# Patient Record
Sex: Female | Born: 1970 | Race: White | Hispanic: No | Marital: Married | Smoking: Never smoker
Health system: Southern US, Community
[De-identification: ages and names within clinical notes are randomized; demographics above are authoritative.]

## PROBLEM LIST (undated history)

## (undated) DIAGNOSIS — G5761 Lesion of plantar nerve, right lower limb: Secondary | ICD-10-CM

## (undated) DIAGNOSIS — S93492A Sprain of other ligament of left ankle, initial encounter: Secondary | ICD-10-CM

## (undated) DIAGNOSIS — C539 Malignant neoplasm of cervix uteri, unspecified: Secondary | ICD-10-CM

## (undated) HISTORY — PX: ROTATOR CUFF REPAIR: SHX139

## (undated) HISTORY — PX: ABDOMINAL HYSTERECTOMY: SHX81

## (undated) SURGERY — Surgical Case
Anesthesia: *Unknown

---

## 2005-08-20 ENCOUNTER — Ambulatory Visit: Payer: Self-pay | Admitting: Unknown Physician Specialty

## 2005-09-05 ENCOUNTER — Observation Stay: Payer: Self-pay | Admitting: Unknown Physician Specialty

## 2005-12-09 ENCOUNTER — Emergency Department: Payer: Self-pay | Admitting: Emergency Medicine

## 2013-05-13 ENCOUNTER — Ambulatory Visit: Payer: Self-pay | Admitting: Orthopedic Surgery

## 2013-06-03 ENCOUNTER — Ambulatory Visit: Payer: Self-pay | Admitting: Orthopedic Surgery

## 2013-07-15 DIAGNOSIS — M7521 Bicipital tendinitis, right shoulder: Secondary | ICD-10-CM | POA: Insufficient documentation

## 2013-07-15 DIAGNOSIS — S43439A Superior glenoid labrum lesion of unspecified shoulder, initial encounter: Secondary | ICD-10-CM | POA: Insufficient documentation

## 2013-07-15 DIAGNOSIS — M7580 Other shoulder lesions, unspecified shoulder: Secondary | ICD-10-CM | POA: Insufficient documentation

## 2013-07-15 DIAGNOSIS — M5412 Radiculopathy, cervical region: Secondary | ICD-10-CM | POA: Insufficient documentation

## 2013-10-25 ENCOUNTER — Ambulatory Visit: Payer: Self-pay | Admitting: Gastroenterology

## 2013-11-03 ENCOUNTER — Ambulatory Visit: Payer: Self-pay | Admitting: Gastroenterology

## 2013-12-06 ENCOUNTER — Ambulatory Visit: Payer: Self-pay | Admitting: Gastroenterology

## 2014-02-04 DIAGNOSIS — R1114 Bilious vomiting: Secondary | ICD-10-CM | POA: Insufficient documentation

## 2014-03-01 ENCOUNTER — Ambulatory Visit: Payer: Self-pay | Admitting: Surgery

## 2014-03-02 DIAGNOSIS — M75111 Incomplete rotator cuff tear or rupture of right shoulder, not specified as traumatic: Secondary | ICD-10-CM | POA: Insufficient documentation

## 2014-04-18 DIAGNOSIS — M7701 Medial epicondylitis, right elbow: Secondary | ICD-10-CM | POA: Insufficient documentation

## 2014-05-11 DIAGNOSIS — M7501 Adhesive capsulitis of right shoulder: Secondary | ICD-10-CM | POA: Insufficient documentation

## 2014-05-25 ENCOUNTER — Ambulatory Visit: Payer: Self-pay | Admitting: Surgery

## 2014-06-08 DIAGNOSIS — G629 Polyneuropathy, unspecified: Secondary | ICD-10-CM | POA: Insufficient documentation

## 2014-06-29 NOTE — Op Note (Signed)
PATIENT NAME:  Meredith Whitaker, Meredith Whitaker MR#:  [id number] DATE OF BIRTH:  1971-01-02  DATE OF PROCEDURE:  03/01/2014  PREOPERATIVE DIAGNOSIS:  Impingement/tendinopathy with superior labral tear from anterior to posterior, right shoulder.  POSTOPERATIVE DIAGNOSES:  1.  Impingement/tendinopathy with partial thickness rotator cuff tears of the subscapularis and supraspinatus tendons.  2.  Superior labral tear from anterior to posterior, right shoulder.  PROCEDURES:  1.  Arthroscopic superior labral tear from anterior to posterior repair.  2.  Arthroscopic subscapularis tendon repair.  3.    Arthroscopic subacromial decompression.  4.  Mini open repair of partial thickness supraspinatus tendon tear. 5.    Biceps tenodesis, right shoulder.  SURGEON: Pascal Lux, MD   ANESTHESIA: General endotracheal with an interscalene block placed preoperative by the anesthesiologist.   FINDINGS:  As noted above. The labral tear extended from the 10 o'clock to the 12:30 position.  There was also some fraying of the anterior labrum.  There was a near full thickness tear involving the superior fibers of the subscapularis tendon, as well as partial thickness tear involving approximately 40% of the footprint of the anterior insertional fibers of the supraspinatus tendon.  The articular surfaces of the glenoid and humerus both were in satisfactory condition.    COMPLICATIONS: None.   ESTIMATED BLOOD LOSS: Minimal.   TOTAL FLUIDS: 1000 mL of crystalloid.   TOURNIQUET: None.  DRAINS: None.  CLOSURE: 2-0 Vicryl subcuticular sutures.    BRIEF CLINICAL NOTE: The patient is a 44 year old female with a 1 year history of right shoulder pain.  Her symptoms have persisted despite medications, activity modification, etc. Her history and examination were consistent with impingement, tendinopathy with a SLAP tear, all of which were confirmed by MRI scan. She presents at this time for arthroscopy,  decompression, and SLAP  repair.   DESCRIPTION OF PROCEDURE: The patient underwent placement of an interscalene block in the preoperative holding area before she was brought into the operating room and lain in the supine position. After adequate general endotracheal intubation and anesthesia were obtained, the patient was repositioned in the beach chair position using the beach chair positioner. The right shoulder and upper extremity were prepped with ChloraPrep solution before being draped sterilely. Preoperative antibiotics were administered. The expected portal sites and incision site were injected with 0.5% Sensorcaine with epinephrine before the camera was placed in the posterior portal. The glenohumeral joint was thoroughly inspected with the findings as described above. An anterior portal was created using an outside-in technique. The labrum was carefully probed, confirming the above-noted findings. The frayed portions of the labrum were lightly debrided using the full radius resector.  In addition, the areas of partial thickness tears of the subscapularis and supraspinatus tendons also were debrided.  The exposed greater tuberosity and lesser tuberosity areas also were debrided down to provide a good bed of bleeding bone to stimulate healing. Finally, the exposed glenoid rim superiorly was roughened with the full radius resector to create a good bed of bleeding bone for the repair.   The labral repair was accomplished using a single BioKnotless anchor placed in the 12 o'clock position through a superolateral portal which had been placed using an outside-in technique. The subscapularis tendon was repaired using a second BioKnotless anchor placed through the anterior portal. Subsequent probing of both repairs demonstrated good stability.  In addition, the biceps tendon was released from its labral attachment, given that both the subscapularis tendon and superior labral regions required repair. The instruments were removed  from the  joint after suctioning the excess fluid.   The camera was repositioned though the posterior portal into the subacromial space. A separate lateral portal was created using an outside-in technique. The bursal tissues were debrided using the full radius resector before the ArthroCare wand was inserted and used to obtain hemostasis.  It also was used to reassess the coracoacromial ligament from its attachment along the anterior and lateral margins of the acromion as well as to remove the periosteal tissues off the undersurface of the anterior third of the acromion. A 4 mm Acromionizer bur was then utilized to complete the decompression by removing the undersurface of the anterior third of the acromion.  The instruments were then removed from the joint after suctioning the excess fluid.   An approximately 4-5 cm incision was made over the anterolateral aspect of the shoulder, incorporating the superolateral portal site.  The incision was carried down through the subcutaneous tissues to expose the deltoid fascia. The raphe between the anterior and middle thirds was developed to provide access into the subacromial space. Additional bursal tissues were debrided sharply.  The area of partial thickness tearing of the anterior third of the supraspinatus tendon was identified by palpation. A small incision was made longitudinally in line with the supraspinatus fibers to provide access to the torn portion of the tendon.  A single 2.9 JuggerKnot anchor was inserted and pulled tightly before both sets of sutures were passed on either side of the defect to effect the repair.  An apparent watertight closure was obtained.  The bicipital groove was identified by palpation and opened with the Bovie.  The biceps tendon was delivered through this defect into the wound.  The floor of the bicipital groove was roughened with a curet before a second 2.9 JuggerKnot anchor was inserted.  Again, both sets of sutures were passed through  the tendon and tied securely to effect the tenodesis.  The bicipital sheath was closed using #0 Ethibond interrupted sutures, incorporating the biceps tendon in order to further reinforce the tenodesis.   The wound was copiously irritated with sterile saline solution before the deltoid raphe was reapproximated using 2-0 Vicryl interrupted sutures. The subcutaneous tissues were closed with 2-0 Vicryl interrupted sutures before the skin was closed using 2-0 Vicryl inverted subcuticular sutures.  The portal sites also were closed using 2-0 Vicryl subcuticular inverted sutures before Steri-Strips were applied to the skin.  A sterile bulky occlusive dressing was applied at the shoulder before the arm was placed into a shoulder immobilizer.  The patient was then awakened, extubated, and returned to the recovery room in satisfactory condition after tolerating the procedure well.      ____________________________ J. Dorien Chihuahua, MD jjp:LT D: 03/01/2014 16:25:54 ET T: 03/01/2014 17:07:41 ET JOB#: [id number]  cc: Pascal Lux, MD, <Dictator> JEFF Robby Sermon MD ELECTRONICALLY SIGNED 03/15/2014 11:39

## 2014-07-07 DIAGNOSIS — M25541 Pain in joints of right hand: Secondary | ICD-10-CM | POA: Insufficient documentation

## 2014-07-07 DIAGNOSIS — R202 Paresthesia of skin: Secondary | ICD-10-CM | POA: Insufficient documentation

## 2014-07-07 DIAGNOSIS — R200 Anesthesia of skin: Secondary | ICD-10-CM | POA: Insufficient documentation

## 2014-07-07 DIAGNOSIS — R29898 Other symptoms and signs involving the musculoskeletal system: Secondary | ICD-10-CM | POA: Insufficient documentation

## 2014-07-20 DIAGNOSIS — G5601 Carpal tunnel syndrome, right upper limb: Secondary | ICD-10-CM | POA: Insufficient documentation

## 2016-09-11 ENCOUNTER — Encounter: Payer: Self-pay | Admitting: *Deleted

## 2016-09-11 ENCOUNTER — Emergency Department
Admission: EM | Admit: 2016-09-11 | Discharge: 2016-09-11 | Disposition: A | Discharge status: Home / Self Care | Payer: No Typology Code available for payment source | Attending: Emergency Medicine | Admitting: Emergency Medicine

## 2016-09-11 DIAGNOSIS — Z23 Encounter for immunization: Secondary | ICD-10-CM | POA: Insufficient documentation

## 2016-09-11 DIAGNOSIS — S8991XA Unspecified injury of right lower leg, initial encounter: Secondary | ICD-10-CM | POA: Diagnosis present

## 2016-09-11 DIAGNOSIS — Y999 Unspecified external cause status: Secondary | ICD-10-CM | POA: Insufficient documentation

## 2016-09-11 DIAGNOSIS — S81851A Open bite, right lower leg, initial encounter: Secondary | ICD-10-CM | POA: Diagnosis not present

## 2016-09-11 DIAGNOSIS — W540XXA Bitten by dog, initial encounter: Secondary | ICD-10-CM | POA: Insufficient documentation

## 2016-09-11 DIAGNOSIS — S81811A Laceration without foreign body, right lower leg, initial encounter: Secondary | ICD-10-CM | POA: Diagnosis not present

## 2016-09-11 DIAGNOSIS — S81831A Puncture wound without foreign body, right lower leg, initial encounter: Secondary | ICD-10-CM | POA: Insufficient documentation

## 2016-09-11 DIAGNOSIS — Y929 Unspecified place or not applicable: Secondary | ICD-10-CM | POA: Diagnosis not present

## 2016-09-11 DIAGNOSIS — Y939 Activity, unspecified: Secondary | ICD-10-CM | POA: Diagnosis not present

## 2016-09-11 MED ORDER — AMOXICILLIN-POT CLAVULANATE 875-125 MG PO TABS: 1.0000 | ORAL_TABLET | Freq: Two times a day (BID) | ORAL | 0 refills | Status: AC

## 2016-09-11 MED ORDER — LIDOCAINE HCL (PF) 1 % IJ SOLN
5.0000 mL | Freq: Once | INTRAMUSCULAR | Status: AC
  Administered 2016-09-11: 5 mL
  Filled 2016-09-11: qty 5

## 2016-09-11 MED ORDER — KETOROLAC TROMETHAMINE 30 MG/ML IJ SOLN
30.0000 mg | Freq: Once | INTRAMUSCULAR | Status: AC
  Administered 2016-09-11: 30 mg via INTRAMUSCULAR
  Filled 2016-09-11: qty 1

## 2016-09-11 MED ORDER — CYCLOBENZAPRINE HCL 5 MG PO TABS: 5.0000 mg | ORAL_TABLET | Freq: Three times a day (TID) | ORAL | 0 refills | Status: DC | PRN

## 2016-09-11 MED ORDER — CYCLOBENZAPRINE HCL 10 MG PO TABS
5.0000 mg | ORAL_TABLET | Freq: Once | ORAL | Status: AC
  Administered 2016-09-11: 5 mg via ORAL
  Filled 2016-09-11: qty 1

## 2016-09-11 MED ORDER — AMOXICILLIN-POT CLAVULANATE 875-125 MG PO TABS
1.0000 | ORAL_TABLET | Freq: Once | ORAL | Status: AC
  Administered 2016-09-11: 1 via ORAL
  Filled 2016-09-11: qty 1

## 2016-09-11 MED ORDER — TETANUS-DIPHTH-ACELL PERTUSSIS 5-2.5-18.5 LF-MCG/0.5 IM SUSP
0.5000 mL | Freq: Once | INTRAMUSCULAR | Status: AC
  Administered 2016-09-11: 0.5 mL via INTRAMUSCULAR
  Filled 2016-09-11: qty 0.5

## 2016-09-11 NOTE — Discharge Instructions (Signed)
You may shower but do not submerge wounds underwater. Take antibiotics as prescribed. Alternate Tylenol and ibuprofen as needed for mild to moderate pain. He may use Flexeril as needed for nighttime pain if needed. Ice areas 20 minutes every hour for the next 2-3 days. Have sutures removed in 8-10 days.

## 2016-09-11 NOTE — ED Triage Notes (Signed)
Pt has a dogbite to right upper thigh and puncture wounds x 2 to right lower leg   Bleeding controlled.   Denies other injury

## 2016-09-11 NOTE — ED Notes (Signed)
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 Pt reports incident happened around 18:00 did not report reports per her customer dog had all vaccination. RN informed BP officer who reports will call [employer]

## 2016-09-11 NOTE — ED Provider Notes (Signed)
[facility] Provider Note   CSN: [id number] Arrival date & time: 09/11/16  1930     History   Chief Complaint Chief Complaint  Patient presents with  . Animal Bite    HPI Meredith Whitaker is a 46 y.o. female presents to the emergency department for evaluation of animal bite to the right leg. Patient states she was bit by a client's dog just prior to arrival. She has been in the right proximal thigh, right mid tibia. She suffered a laceration to the right proximal thigh puncture wound to the right mid tibia. Tetanus is not up-to-date. Her pain is moderate. She has not had any medications for pain. She denies any numbness or tingling in the right lower extremity. She is ambulatory with assistive devices. She denies any other injury to her body. Dog's vaccinations are up-to-date, patient presents with copies of vaccination records. Animal control has been notified.  HPI  No past medical history on file.  There are no active problems to display for this patient.   No past surgical history on file.  OB History    No data available       Home Medications    Prior to Admission medications   Medication Sig Start Date End Date Taking? Authorizing Provider  amoxicillin-clavulanate (AUGMENTIN) 875-125 MG tablet Take 1 tablet by mouth every 12 (twelve) hours. 09/11/16 09/21/16  Duanne Guess, PA-C  cyclobenzaprine (FLEXERIL) 5 MG tablet Take 1-2 tablets (5-10 mg total) by mouth 3 (three) times daily as needed for muscle spasms. 09/11/16   Duanne Guess, PA-C    Family History No family history on file.  Social History Social History  Substance Use Topics  . Smoking status: Never Smoker  . Smokeless tobacco: Never Used  . Alcohol use No     Allergies   Codeine and Oxycodone   Review of Systems Review of Systems  Constitutional: Negative for fever.  Musculoskeletal: Positive for myalgias. Negative for gait problem and joint swelling.  Skin: Positive for  wound.  Neurological: Negative for weakness, numbness and headaches.     Physical Exam Updated Vital Signs BP (!) 147/88 (BP Location: Left Arm)   Pulse 60   Temp 99.7 F (37.6 C) (Oral)   Resp 20   Ht 5\' 1"  (1.549 m)   Wt 49 kg (108 lb)   LMP  (Exact Date)   SpO2 99%   BMI 20.41 kg/m   Physical Exam  Constitutional: She appears well-developed and well-nourished.  HENT:  Head: Normocephalic and atraumatic.  Eyes: Conjunctivae are normal.  Neck: Normal range of motion.  Cardiovascular: Normal rate.   Pulmonary/Chest: No respiratory distress.  Abdominal: Soft. She exhibits no distension.  Musculoskeletal:  For similar laceration noted to the right proximal anterior thigh, no active bleeding. Patient has no pain with use of her quadriceps. No palpable or visible foreign body identified. Patient has a puncture wound to the right anterior mid tibia with a small nearby abrasion. No palpable or visible foreign body. Patient's given ankle plantarflexion and dorsiflexion. There is no swelling or sign of compartment syndrome. She is nervous intact in right lower extremity.  Psychiatric: She has a normal mood and affect. Her behavior is normal. Judgment and thought content normal.     ED Treatments / Results  Labs (all labs ordered are listed, but only abnormal results are displayed) Labs Reviewed - No data to display  EKG  EKG Interpretation None       Radiology  No results found.  Procedures Procedures (including critical care time) LACERATION REPAIR Performed by: Feliberto Gottron Authorized by: Feliberto Gottron Consent: Verbal consent obtained. Risks and benefits: risks, benefits and alternatives were discussed Consent given by: patient Patient identity confirmed: provided demographic data Prepped and Draped in normal sterile fashion Wound explored  Laceration Location: Right proximal anterior thigh  Laceration Length: 4 cm  No Foreign  Bodies seen or palpated  Anesthesia: local infiltration  Local anesthetic: lidocaine 1 % without epinephrine  Anesthetic total: 3 ml  Irrigation method: syringe Amount of cleaning: standard  Skin closure: Simple interrupted 5-0 nylon   Number of sutures: #9   Technique: Simple interrupted 5-0 nylon, one Steri-Strip for puncture wound   Patient tolerance: Patient tolerated the procedure well with no immediate complications.   Medications Ordered in ED Medications  ketorolac (TORADOL) 30 MG/ML injection 30 mg (30 mg Intramuscular Given 09/11/16 2022)  cyclobenzaprine (FLEXERIL) tablet 5 mg (5 mg Oral Given 09/11/16 2023)  lidocaine (PF) (XYLOCAINE) 1 % injection 5 mL (5 mLs Infiltration Given 09/11/16 2023)  amoxicillin-clavulanate (AUGMENTIN) 875-125 MG per tablet 1 tablet (1 tablet Oral Given 09/11/16 2023)  Tdap (BOOSTRIX) injection 0.5 mL (0.5 mLs Intramuscular Given 09/11/16 2101)     Initial Impression / Assessment and Plan / ED Course  I have reviewed the triage vital signs and the nursing notes.  Pertinent labs & imaging results that were available during my care of the patient were reviewed by me and considered in my medical decision making (see chart for details).     46 year old female with laceration and puncture wound to the right leg. Laceration is repaired with #9 5-0 nylon sutures. Wound is thoroughly irrigated and no palpable or visible foreign body. Small puncture wound to the right lower leg irrigated, no palpable or visible foreign body. Station applied. She is placed on Augmentin. Tetanus is updated in the emergency department. She will follow-up in 8-10 days for suture removal. Animal control is notified. She is educated on signs and symptoms to return to the ED for.  Final Clinical Impressions(s) / ED Diagnoses   Final diagnoses:  Dog bite, initial encounter  Laceration of right lower extremity, initial encounter  Puncture wound of multiple sites of right  lower extremity, initial encounter    New Prescriptions New Prescriptions   AMOXICILLIN-CLAVULANATE (AUGMENTIN) 875-125 MG TABLET    Take 1 tablet by mouth every 12 (twelve) hours.   CYCLOBENZAPRINE (FLEXERIL) 5 MG TABLET    Take 1-2 tablets (5-10 mg total) by mouth 3 (three) times daily as needed for muscle spasms.     Renata Caprice 09/11/16 2111    Carrie Mew, MD 09/17/16 2328

## 2016-09-22 ENCOUNTER — Emergency Department
Admission: EM | Admit: 2016-09-22 | Discharge: 2016-09-22 | Disposition: A | Discharge status: Home / Self Care | Payer: Self-pay | Attending: Emergency Medicine | Admitting: Emergency Medicine

## 2016-09-22 ENCOUNTER — Encounter: Payer: Self-pay | Admitting: Emergency Medicine

## 2016-09-22 DIAGNOSIS — Z4802 Encounter for removal of sutures: Secondary | ICD-10-CM

## 2016-09-22 DIAGNOSIS — Z8541 Personal history of malignant neoplasm of cervix uteri: Secondary | ICD-10-CM | POA: Insufficient documentation

## 2016-09-22 HISTORY — DX: Malignant neoplasm of cervix uteri, unspecified: C53.9

## 2016-09-22 NOTE — Discharge Instructions (Signed)
Continue to keep area clean and dry. Follow-up with [facility] clinic acute-care if any continued problems.

## 2016-09-22 NOTE — ED Provider Notes (Signed)
[facility] Emergency Department Provider Note ____________________________________________  Time seen: 10:25 AM  I have reviewed the triage vital signs and the nursing notes.  HISTORY  Chief Complaint  Suture / Staple Removal   HPI Meredith Whitaker is a 46 y.o. female is here for suture removal from her right upper thigh. Patient states she was seen in the emergency room for a dog bite. She denies any pain or drainage from the area. Sutures were placed 09/11/16.  Past Medical History:  Diagnosis Date  . Cervical cancer ([place])     There are no active problems to display for this patient.   Past Surgical History:  Procedure Laterality Date  . ABDOMINAL HYSTERECTOMY    . ROTATOR CUFF REPAIR Right     Prior to Admission medications   Medication Sig Start Date End Date Taking? Authorizing Provider  cyclobenzaprine (FLEXERIL) 5 MG tablet Take 1-2 tablets (5-10 mg total) by mouth 3 (three) times daily as needed for muscle spasms. 09/11/16   Duanne Guess, PA-C    Allergies Codeine and Oxycodone  No family history on file.  Social History Social History  Substance Use Topics  . Smoking status: Never Smoker  . Smokeless tobacco: Never Used  . Alcohol use No    Review of Systems  Constitutional: Negative for fever. Cardiovascular: Negative for chest pain. Respiratory: Negative for shortness of breath. Skin: Positive for healing laceration. Neurological: Negative for  focal weakness or numbness. ____________________________________________  PHYSICAL EXAM:  VITAL SIGNS: ED Triage Vitals [09/22/16 1012]  Enc Vitals Group     BP 133/74     Pulse Rate 67     Resp 16     Temp 98.9 F (37.2 C)     Temp Source Oral     SpO2 99 %     Weight      Height      Head Circumference      Peak Flow      Pain Score      Pain Loc      Pain Edu?      Excl. in [place]?     Constitutional: Alert and oriented. Well appearing and in no distress. Head:  Normocephalic and atraumatic. Eyes: Conjunctivae are normal.  Neck: No stridor Musculoskeletal: Nontender with normal range of motion in all extremities.  Neurologic:  Normal gait without ataxia. Normal speech and language. No gross focal neurologic deficits are appreciated. Skin:  Skin is warm, dry. Suture site is healing without any signs of infection. Nontender to touch. Psychiatric: Mood and affect are normal. Patient exhibits appropriate insight and judgment. ____________________________________________   INITIAL IMPRESSION / ASSESSMENT AND PLAN / ED COURSE  Sutures were removed by RN. Patient is to follow-up with her PCP if any continued problems. She was given instructions for aftercare of suture removal.    ____________________________________________  FINAL CLINICAL IMPRESSION(S) / ED DIAGNOSES  Final diagnoses:  Encounter for removal of sutures     Johnn Hai, PA-C 09/22/16 1101    Nena Polio, MD 09/22/16 1642

## 2016-09-22 NOTE — ED Notes (Signed)
Pt verbalized understanding of discharge instructions. NAD at this time. 

## 2016-09-22 NOTE — ED Triage Notes (Signed)
Pt states that she is here to have sutures removed. Denies any pain or complaints at this time

## 2018-08-14 ENCOUNTER — Encounter: Payer: Self-pay | Admitting: Emergency Medicine

## 2018-08-14 ENCOUNTER — Emergency Department
Admission: EM | Admit: 2018-08-14 | Discharge: 2018-08-14 | Disposition: A | Discharge status: Home / Self Care | Payer: Self-pay | Attending: Emergency Medicine | Admitting: Emergency Medicine

## 2018-08-14 ENCOUNTER — Emergency Department: Payer: Self-pay

## 2018-08-14 ENCOUNTER — Other Ambulatory Visit: Payer: Self-pay

## 2018-08-14 DIAGNOSIS — Z8541 Personal history of malignant neoplasm of cervix uteri: Secondary | ICD-10-CM | POA: Insufficient documentation

## 2018-08-14 DIAGNOSIS — Y998 Other external cause status: Secondary | ICD-10-CM | POA: Insufficient documentation

## 2018-08-14 DIAGNOSIS — S61216A Laceration without foreign body of right little finger without damage to nail, initial encounter: Secondary | ICD-10-CM | POA: Insufficient documentation

## 2018-08-14 DIAGNOSIS — Y939 Activity, unspecified: Secondary | ICD-10-CM | POA: Insufficient documentation

## 2018-08-14 DIAGNOSIS — Y92812 Truck as the place of occurrence of the external cause: Secondary | ICD-10-CM | POA: Insufficient documentation

## 2018-08-14 DIAGNOSIS — S62639B Displaced fracture of distal phalanx of unspecified finger, initial encounter for open fracture: Secondary | ICD-10-CM

## 2018-08-14 DIAGNOSIS — S62636A Displaced fracture of distal phalanx of right little finger, initial encounter for closed fracture: Secondary | ICD-10-CM | POA: Insufficient documentation

## 2018-08-14 DIAGNOSIS — W231XXA Caught, crushed, jammed, or pinched between stationary objects, initial encounter: Secondary | ICD-10-CM | POA: Insufficient documentation

## 2018-08-14 MED ORDER — NAPROXEN 500 MG PO TABS
500.0000 mg | ORAL_TABLET | Freq: Once | ORAL | Status: AC
  Administered 2018-08-14: 500 mg via ORAL
  Filled 2018-08-14: qty 1

## 2018-08-14 MED ORDER — NAPROXEN 500 MG PO TABS: 500.0000 mg | ORAL_TABLET | Freq: Two times a day (BID) | ORAL | Status: DC

## 2018-08-14 MED ORDER — LIDOCAINE HCL (PF) 1 % IJ SOLN
INTRAMUSCULAR | Status: AC
  Administered 2018-08-14: 5 mL
  Filled 2018-08-14: qty 5

## 2018-08-14 MED ORDER — SULFAMETHOXAZOLE-TRIMETHOPRIM 800-160 MG PO TABS: 1.0000 | ORAL_TABLET | Freq: Two times a day (BID) | ORAL | 0 refills | Status: DC

## 2018-08-14 MED ORDER — SULFAMETHOXAZOLE-TRIMETHOPRIM 800-160 MG PO TABS
1.0000 | ORAL_TABLET | Freq: Once | ORAL | Status: AC
  Administered 2018-08-14: 1 via ORAL
  Filled 2018-08-14: qty 1

## 2018-08-14 MED ORDER — BACITRACIN-NEOMYCIN-POLYMYXIN 400-5-5000 EX OINT
TOPICAL_OINTMENT | Freq: Once | CUTANEOUS | Status: AC
  Administered 2018-08-14: 1 via TOPICAL
  Filled 2018-08-14: qty 1

## 2018-08-14 MED ORDER — LIDOCAINE HCL (PF) 1 % IJ SOLN
5.0000 mL | Freq: Once | INTRAMUSCULAR | Status: AC
  Administered 2018-08-14: 5 mL

## 2018-08-14 NOTE — ED Triage Notes (Signed)
Patient presents to the ED with a laceration to her right pinky that occurred from shutting it in the tailgate of a dump truck.  Patient states she went to urgent care and they told her the laceration was too severe for them to repair.  Patient is in no obvious distress at this time.  Finger has a tight bandage over it at this time.

## 2018-08-14 NOTE — ED Provider Notes (Signed)
[facility] Emergency Department Provider Note   ____________________________________________   None    (approximate)  I have reviewed the triage vital signs and the nursing notes.   HISTORY  Chief Complaint Extremity Laceration    HPI Meredith Whitaker is a 48 y.o. female patient presents with with pain secondary to contusion and laceration of the right fifth finger.  Patient state finger was smashed in a tailgate of a dump truck.  Patient was initially evaluated by urgent care clinic and sent to ED for definitive evaluation and treatment.  Hemorrhage is controlled with direct pressure.  Patient has full equal range of motion of the affected extremity.  Patient rates the pain a 7/10.  Patient described pain is "achy".  No palliative measure for complaint.  Tetanus shot is up-to-date.         Past Medical History:  Diagnosis Date  . Cervical cancer ([place])     There are no active problems to display for this patient.   Past Surgical History:  Procedure Laterality Date  . ABDOMINAL HYSTERECTOMY    . ROTATOR CUFF REPAIR Right     Prior to Admission medications   Medication Sig Start Date End Date Taking? Authorizing Provider  naproxen (NAPROSYN) 500 MG tablet Take 1 tablet (500 mg total) by mouth 2 (two) times daily with a meal. 08/14/18   Sable Feil, PA-C  sulfamethoxazole-trimethoprim (BACTRIM DS) 800-160 MG tablet Take 1 tablet by mouth 2 (two) times daily. 08/14/18   Sable Feil, PA-C    Allergies Codeine and Oxycodone  No family history on file.  Social History Social History   Tobacco Use  . Smoking status: Never Smoker  . Smokeless tobacco: Never Used  Substance Use Topics  . Alcohol use: No  . Drug use: No    Review of Systems Constitutional: No fever/chills Eyes: No visual changes. ENT: No sore throat. Cardiovascular: Denies chest pain. Respiratory: Denies shortness of breath. Gastrointestinal: No abdominal pain.  No  nausea, no vomiting.  No diarrhea.  No constipation. Genitourinary: Negative for dysuria. Musculoskeletal: Right fifth finger pain. Skin: Negative for rash.  Laceration lateral aspect the fifth digit right hand. Neurological: Negative for headaches, focal weakness or numbness. Allergic/Immunilogical: Codeine medication. ____________________________________________   PHYSICAL EXAM:  VITAL SIGNS: ED Triage Vitals  Enc Vitals Group     BP 08/14/18 0852 (!) 161/78     Pulse Rate 08/14/18 0852 73     Resp 08/14/18 0852 16     Temp 08/14/18 0852 98.5 F (36.9 C)     Temp Source 08/14/18 0852 Oral     SpO2 08/14/18 0852 97 %     Weight 08/14/18 0852 116 lb (52.6 kg)     Height 08/14/18 0852 5' (1.524 m)     Head Circumference --      Peak Flow --      Pain Score 08/14/18 0851 7     Pain Loc --      Pain Edu? --      Excl. in [place]? --    Constitutional: Alert and oriented. Well appearing and in no acute distress. Cardiovascular: Normal rate, regular rhythm. Grossly normal heart sounds.  Good peripheral circulation. Respiratory: Normal respiratory effort.  No retractions. Lungs CTAB. Musculoskeletal: Edema to the fifth digit right hand.  Neurologic:  Normal speech and language. No gross focal neurologic deficits are appreciated. No gait instability. Skin: 1.5 cm laceration of the lateral aspect of the fifth digit  right hand. Psychiatric: Mood and affect are normal. Speech and behavior are normal.  ____________________________________________   LABS (all labs ordered are listed, but only abnormal results are displayed)  Labs Reviewed - No data to display ____________________________________________  EKG   ____________________________________________  RADIOLOGY  ED MD interpretation:    Official radiology report(s): Dg Finger Little Left  Result Date: 08/14/2018 CLINICAL DATA:  Crushing injury, laceration EXAM: LEFT LITTLE FINGER 2+V COMPARISON:  None. FINDINGS: There is a  minimally displaced fracture of the lateral tuft of the right fifth distal phalanx. Joint spaces are well preserved. Soft tissue edema. IMPRESSION: There is a minimally displaced fracture of the lateral tuft of the right fifth distal phalanx. Joint spaces are well preserved. Soft tissue edema. Electronically Signed   By: Eddie Candle M.D.   On: 08/14/2018 09:29    ____________________________________________   PROCEDURES  Procedure(s) performed (including Critical Care):  Marland KitchenMarland KitchenLaceration Repair  Date/Time: 08/14/2018 10:11 AM Performed by: Sable Feil, PA-C Authorized by: Sable Feil, PA-C   Consent:    Consent obtained:  Verbal   Consent given by:  Patient   Risks discussed:  Infection, pain and poor cosmetic result Anesthesia (see MAR for exact dosages):    Anesthesia method:  Nerve block   Block anesthetic:  Lidocaine 1% w/o epi   Block injection procedure:  Anatomic landmarks identified and incremental injection   Block outcome:  Anesthesia achieved Laceration details:    Location:  Finger   Finger location:  R small finger   Length (cm):  1.5 Repair type:    Repair type:  Simple Pre-procedure details:    Preparation:  Patient was prepped and draped in usual sterile fashion and imaging obtained to evaluate for foreign bodies Exploration:    Contaminated: no   Treatment:    Area cleansed with:  Betadine and saline   Amount of cleaning:  Standard   Irrigation solution:  Sterile saline   Irrigation method:  Syringe   Visualized foreign bodies/material removed: no   Skin repair:    Repair method:  Sutures   Suture size:  4-0   Suture material:  Nylon   Suture technique:  Simple interrupted   Number of sutures:  5 Approximation:    Approximation:  Close Post-procedure details:    Dressing:  Antibiotic ointment, sterile dressing and splint for protection   Patient tolerance of procedure:  Tolerated well, no immediate complications      ____________________________________________   INITIAL IMPRESSION / ASSESSMENT AND PLAN / ED COURSE  As part of my medical decision making, I reviewed the following data within the [facility]         Patient presents with pain secondary to contusion/ laceration to the fifth digit right hand.  Differential differential diagnosis consists of a open fracture versus simple laceration to the fifth digit right hand.  Will obtain x-ray for definitive diagnosis and if open fracture will contact orthopedics.      ____________________________________________   FINAL CLINICAL IMPRESSION(S) / ED DIAGNOSES  Final diagnoses:  Open fracture of tuft of distal phalanx of finger     ED Discharge Orders         Ordered    naproxen (NAPROSYN) 500 MG tablet  2 times daily with meals     08/14/18 1009    sulfamethoxazole-trimethoprim (BACTRIM DS) 800-160 MG tablet  2 times daily     08/14/18 1009           Note:  This document was prepared using Dragon voice recognition software and may include unintentional dictation errors.    Sable Feil, PA-C 08/14/18 1014    Earleen Newport, MD 08/14/18 1246

## 2018-08-14 NOTE — Discharge Instructions (Signed)
Follow discharge care instruction and wear splint until evaluation by orthopedics.

## 2018-08-14 NOTE — ED Notes (Signed)
See triage note  Presents with laceration to right 5 th finger  States she caught it when she shut a tail gate of dump truck  Laceration noted to lateral aspect of finger

## 2019-06-18 IMAGING — DX LEFT LITTLE FINGER 2+V
3 series · 3 of 3 positions shown · non-contrast
Comparison: None.

CLINICAL DATA: Crushing injury, laceration

EXAM:
LEFT LITTLE FINGER 2+V

[finger obl]
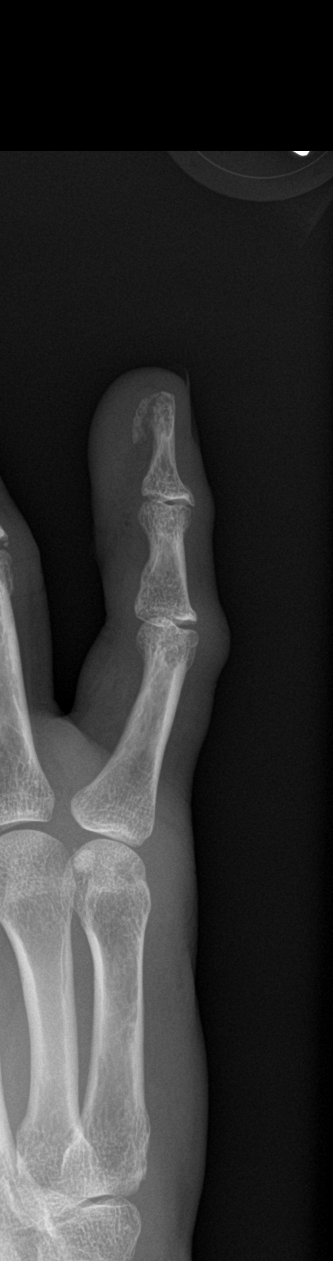

[finger lat]
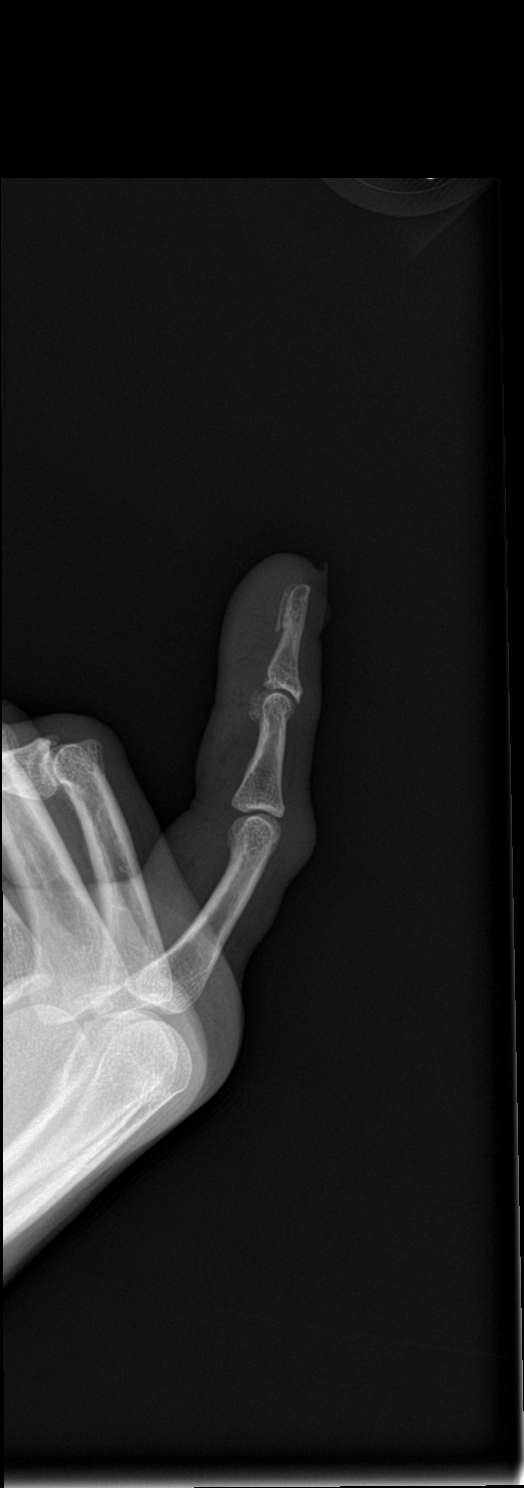

[finger ap]
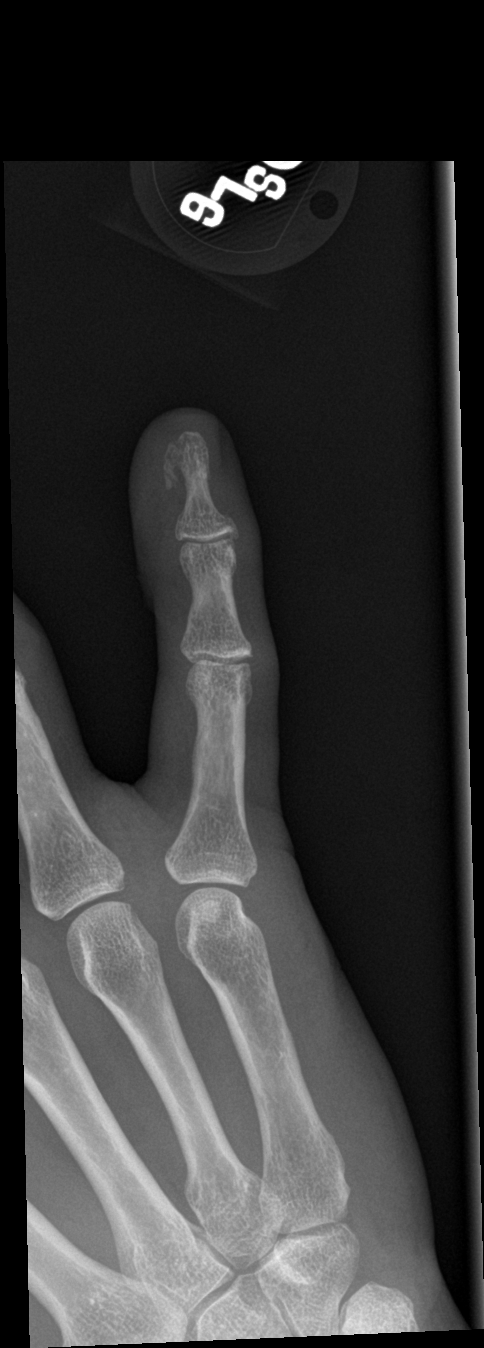

[3 of 3 positions shown; findings below may reference images not displayed]

FINDINGS: There is a minimally displaced fracture of the lateral tuft of the
right fifth distal phalanx. Joint spaces are well preserved. Soft
tissue edema.
IMPRESSION: There is a minimally displaced fracture of the lateral tuft of the
right fifth distal phalanx. Joint spaces are well preserved. Soft
tissue edema.

## 2023-04-16 ENCOUNTER — Ambulatory Visit (INDEPENDENT_AMBULATORY_CARE_PROVIDER_SITE_OTHER): Payer: No Typology Code available for payment source | Admitting: Podiatry

## 2023-04-16 ENCOUNTER — Encounter: Payer: Self-pay | Admitting: Podiatry

## 2023-04-16 ENCOUNTER — Ambulatory Visit (INDEPENDENT_AMBULATORY_CARE_PROVIDER_SITE_OTHER): Payer: No Typology Code available for payment source

## 2023-04-16 DIAGNOSIS — M7751 Other enthesopathy of right foot: Secondary | ICD-10-CM

## 2023-04-16 DIAGNOSIS — G5781 Other specified mononeuropathies of right lower limb: Secondary | ICD-10-CM | POA: Diagnosis not present

## 2023-04-16 DIAGNOSIS — M778 Other enthesopathies, not elsewhere classified: Secondary | ICD-10-CM

## 2023-04-16 MED ORDER — TRIAMCINOLONE ACETONIDE 40 MG/ML IJ SUSP
20.0000 mg | Freq: Once | INTRAMUSCULAR | Status: AC
  Administered 2023-04-16: 20 mg

## 2023-04-16 NOTE — Progress Notes (Signed)
  Subjective:  Patient ID: Meredith Whitaker, female    DOB: [birth year],  MRN: [id number] HPI Chief Complaint  Patient presents with   Foot Pain    5th MPJ right - swells, redness, but feels cold, ongoing for years intermittently, worsened recently, woke up one night and couldn't put weight onto foot, radiates into 3rd, 4th, 5th toes, tried advil-no help   New Patient (Initial Visit)    53 y.o. female presents with the above complaint.   ROS: Denies fever chills nausea right muscle aches pains calf pain back pain chest pain shortness of breath.  Past Medical History:  Diagnosis Date   Cervical cancer [facility])    Past Surgical History:  Procedure Laterality Date   ABDOMINAL HYSTERECTOMY     ROTATOR CUFF REPAIR Right     Current Outpatient Medications:    Multiple Vitamin (MULTIVITAMIN) capsule, Take 1 capsule by mouth daily., Disp: , Rfl:    VITAMIN D PO, Take by mouth., Disp: , Rfl:   Allergies  Allergen Reactions   Tramadol     Other Reaction(s): Other (See Comments)  Other reaction(s): Other (See Comments)  Heart racing  Heart racing  Other reaction(s): Other (See Comments)  Heart racing   Codeine Rash   Oxycodone Rash   Review of Systems Objective:  There were no vitals filed for this visit.  General: Well developed, nourished, in no acute distress, alert and oriented x3   Dermatological: Skin is warm, dry and supple bilateral. Nails x 10 are well maintained; remaining integument appears unremarkable at this time. There are no open sores, no preulcerative lesions, no rash or signs of infection present.  Vascular: Dorsalis Pedis artery and Posterior Tibial artery pedal pulses are 2/4 bilateral with immedate capillary fill time. Pedal hair growth present. No varicosities and no lower extremity edema present bilateral.   Neruologic: Grossly intact via light touch bilateral. Vibratory intact via tuning fork bilateral. Protective threshold with Semmes Wienstein monofilament  intact to all pedal sites bilateral. Patellar and Achilles deep tendon reflexes 2+ bilateral. No Babinski or clonus noted bilateral.  Palpable Mulder's click to the third interdigital space of the right foot which causes the fourth interdigital space to be painful as well she also has a painful Mulder's click to the fourth interdigital space of the right foot.  Musculoskeletal: No gross boney pedal deformities bilateral. No pain, crepitus, or limitation noted with foot and ankle range of motion bilateral. Muscular strength 5/5 in all groups tested bilateral.  Gait: Unassisted, Nonantalgic.    Radiographs:  Radiographs taken today demonstrate osseously mature individual no significant osseous abnormalities with good bone mineralization.  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  Tolerated procedure well without complications follow-up with her on an as-needed basis if this does not resolve.  We did discuss appropriate shoe gear.     Annakate Soulier T. [place], [place]

## 2023-05-26 ENCOUNTER — Ambulatory Visit (INDEPENDENT_AMBULATORY_CARE_PROVIDER_SITE_OTHER): Admitting: Podiatry

## 2023-05-26 ENCOUNTER — Encounter: Payer: Self-pay | Admitting: Podiatry

## 2023-05-26 DIAGNOSIS — M7989 Other specified soft tissue disorders: Secondary | ICD-10-CM | POA: Diagnosis not present

## 2023-05-26 DIAGNOSIS — G5781 Other specified mononeuropathies of right lower limb: Secondary | ICD-10-CM | POA: Diagnosis not present

## 2023-05-26 NOTE — Progress Notes (Signed)
 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  To the feels like his carpal tunnel disease but feels like there is a mass in here she points between her third and fourth and fourth and fifth digits radiating proximally to the proximal fifth metatarsal.  She has soft tissue mass between the third and fourth metatarsals that is exquisitely tender as well as the fourth and fifth.  Most likely neuroma.  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  Plan: Requesting MRI for evaluation of this recalcitrant pain to the right foot.

## 2023-05-26 NOTE — Addendum Note (Signed)
 Addended by: Elveria Royals on: 05/26/2023 02:38 PM   Modules accepted: Orders

## 2023-05-27 ENCOUNTER — Telehealth: Payer: Self-pay

## 2023-05-27 DIAGNOSIS — M7989 Other specified soft tissue disorders: Secondary | ICD-10-CM

## 2023-05-27 DIAGNOSIS — G5781 Other specified mononeuropathies of right lower limb: Secondary | ICD-10-CM

## 2023-05-27 NOTE — Telephone Encounter (Signed)
 PA needs additional documentation before it is approved. I did send in xray pictures along with order.

## 2023-05-28 NOTE — Telephone Encounter (Signed)
 Ultrasound ordered for [facility].

## 2023-06-04 ENCOUNTER — Telehealth: Payer: Self-pay | Admitting: Podiatry

## 2023-06-04 NOTE — Telephone Encounter (Signed)
 Pt called stating she has called and left messages because her ins does not alow her to use the facility you sent order too for test.

## 2023-06-10 ENCOUNTER — Telehealth: Payer: Self-pay | Admitting: *Deleted

## 2023-06-10 DIAGNOSIS — G5781 Other specified mononeuropathies of right lower limb: Secondary | ICD-10-CM

## 2023-06-10 DIAGNOSIS — M7989 Other specified soft tissue disorders: Secondary | ICD-10-CM

## 2023-06-10 NOTE — Telephone Encounter (Signed)
-----   Message from [facility] Evie J sent at 06/09/2023 10:50 AM EDT ----- Patient called back and [facility] is in network with her insurance. Order needs to be changed to [facility].

## 2023-06-18 NOTE — Telephone Encounter (Signed)
 The patient called and wanted to know why the office had not called her about her testing. I spoke with Meredith Whitaker and she reports there is no approval needed and she can go to [facility] for her Ultrasound.  I called to let her know that no authorization is needed and she will call [facility] to set up her testing. She did not have any other questions and voices understanding.

## 2023-06-23 ENCOUNTER — Ambulatory Visit
Admission: RE | Admit: 2023-06-23 | Discharge: 2023-06-23 | Disposition: A | Discharge status: Home / Self Care | Source: Ambulatory Visit | Attending: Podiatry | Admitting: Podiatry

## 2023-06-23 DIAGNOSIS — M7989 Other specified soft tissue disorders: Secondary | ICD-10-CM | POA: Diagnosis present

## 2023-06-23 DIAGNOSIS — G5781 Other specified mononeuropathies of right lower limb: Secondary | ICD-10-CM | POA: Diagnosis present

## 2023-07-16 ENCOUNTER — Ambulatory Visit: Admitting: Podiatry

## 2023-08-01 ENCOUNTER — Telehealth: Payer: Self-pay | Admitting: Podiatry

## 2023-08-01 NOTE — Telephone Encounter (Signed)
 Provider was supposed to refer patient to a neurologist.07/16/23 was her last scheduled appointment. Please advice.

## 2023-08-05 ENCOUNTER — Telehealth: Payer: Self-pay | Admitting: Podiatry

## 2023-08-05 NOTE — Telephone Encounter (Signed)
 Patient has not had the MRI because per insurance an ultrasound had to be done before they will approve MRI.Ultrasound is complete.She need an MRI appointment.

## 2023-08-06 ENCOUNTER — Other Ambulatory Visit: Payer: Self-pay

## 2023-08-06 ENCOUNTER — Telehealth: Payer: Self-pay | Admitting: Podiatry

## 2023-08-06 DIAGNOSIS — G5781 Other specified mononeuropathies of right lower limb: Secondary | ICD-10-CM

## 2023-08-06 DIAGNOSIS — M7989 Other specified soft tissue disorders: Secondary | ICD-10-CM

## 2023-08-06 NOTE — Telephone Encounter (Signed)
 Patient is stating the company Dr. Lara Plants ordered MRI through, contacted patient and stated, insurance is out of network. Patient would like to speak with provider or nurse regarding this issue. [phone]

## 2023-08-08 NOTE — Addendum Note (Signed)
 Addended by: Josephina Nicks on: 08/08/2023 07:29 AM   Modules accepted: Orders

## 2023-08-14 NOTE — Telephone Encounter (Signed)
**Note De-identified  [place] Obfuscation** Please advise 

## 2023-08-27 ENCOUNTER — Ambulatory Visit: Admission: RE | Admit: 2023-08-27 | Source: Ambulatory Visit

## 2023-09-15 ENCOUNTER — Ambulatory Visit

## 2023-09-15 ENCOUNTER — Telehealth: Payer: Self-pay

## 2023-09-15 DIAGNOSIS — G5781 Other specified mononeuropathies of right lower limb: Secondary | ICD-10-CM

## 2023-09-15 DIAGNOSIS — M7989 Other specified soft tissue disorders: Secondary | ICD-10-CM

## 2023-09-15 NOTE — Telephone Encounter (Signed)
 Patient had MRI appointment scheduled 08/27/23 and cancelled appointment. New PA had to be started because PA on file expired. Insurance is requesting documentation of 4 weeks of therapy. The same notes that were submitted when they approved the MRI before.

## 2023-10-06 ENCOUNTER — Ambulatory Visit: Admission: RE | Admit: 2023-10-06 | Source: Ambulatory Visit

## 2023-10-24 NOTE — Telephone Encounter (Signed)
 Insurance is requiring documentation of 4 weeks worth of treatment (physical therapy, chiropractic, osteopathic, manipulative treatment or home exercise program) within the past 6 months.

## 2023-10-27 NOTE — Telephone Encounter (Signed)
Spoke with patient made aware

## 2023-10-29 ENCOUNTER — Ambulatory Visit

## 2023-11-04 ENCOUNTER — Ambulatory Visit

## 2023-11-06 ENCOUNTER — Ambulatory Visit: Attending: Podiatry

## 2023-11-06 ENCOUNTER — Ambulatory Visit

## 2023-11-06 DIAGNOSIS — G5781 Other specified mononeuropathies of right lower limb: Secondary | ICD-10-CM | POA: Insufficient documentation

## 2023-11-06 DIAGNOSIS — M7989 Other specified soft tissue disorders: Secondary | ICD-10-CM | POA: Insufficient documentation

## 2023-11-06 DIAGNOSIS — M79671 Pain in right foot: Secondary | ICD-10-CM | POA: Insufficient documentation

## 2023-11-06 DIAGNOSIS — R262 Difficulty in walking, not elsewhere classified: Secondary | ICD-10-CM | POA: Diagnosis present

## 2023-11-06 NOTE — Therapy (Signed)
 OUTPATIENT PHYSICAL THERAPY  EVALUATION   Patient Name: Meredith Whitaker MRN: [id number] DOB:1970/07/04, 53 y.o., female Today's Date: 11/06/2023  END OF SESSION:  PT End of Session - 11/06/23 1520     Visit Number 1    Number of Visits 17    Date for PT Re-Evaluation 01/02/24    PT Start Time 1520    PT Stop Time 1622    PT Time Calculation (min) 62 min    Activity Tolerance Patient tolerated treatment well    Behavior During Therapy [facility] for tasks assessed/performed          Past Medical History:  Diagnosis Date   Cervical cancer [facility])    Past Surgical History:  Procedure Laterality Date   ABDOMINAL HYSTERECTOMY     ROTATOR CUFF REPAIR Right    Patient Active Problem List   Diagnosis Date Noted   Carpal tunnel syndrome of right wrist 07/20/2014   Pain in thumb joint with movement of right hand 07/07/2014   Numbness and tingling 07/07/2014   Thumb weakness 07/07/2014   Postoperative neuritis of right upper extremity 06/08/2014   Adhesive capsulitis of right shoulder 05/11/2014   Medial epicondylitis of right elbow 04/18/2014   Incomplete tear of right rotator cuff 03/02/2014   Bilious vomiting with nausea 02/04/2014   Biceps tendonitis on right 07/15/2013   Cervical radiculopathy 07/15/2013   SLAP (superior glenoid labrum lesion) 07/15/2013   Subacromial tendonitis 07/15/2013    PCP:   Trudy Dorn BRAVO, MD    REFERRING PROVIDER: Verta Royden DASEN, DPM  REFERRING DIAG: Diagnosis G57.81 (ICD-10-CM) - 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 M79.89 (ICD-10-CM) - Mass of soft tissue of foot  Rationale for Evaluation and Treatment: Rehabilitation  THERAPY DIAG:  Pain in right foot - Plan: PT plan of care cert/re-cert  Difficulty in walking, not elsewhere classified - Plan: PT plan of care cert/re-cert  ONSET DATE: 2024   SUBJECTIVE:                                                                                                                                                                                            SUBJECTIVE STATEMENT: R lateral foot pain: 20/10 (was on her feet a lot, 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   PERTINENT HISTORY:  R foot pain. Pain is located at R 4th and 5th digits and goes to the R 5th metatarsal area. Pain comes and goes. Pain started last year, gradual onset with worsening pain resulting in visit to Dr. Verta in February 2025. Chaning the size and width of  her shoe did not help, nor ice or heat. Pain wakes her up at night.     Blood pressure is controlled per pt.   Latex bands should be fine per pt.     PAIN:  Are you having pain? Yes: NPRS scale: 0/10 (in sitting and walking from waiting room to treatment room, took Advil this morning.  Pain location: R lateral foot (digits 4-5) Pain description: sharp, sore, pins and needles, achy Aggravating factors: walking, being on her feet a lot all day, stair negotiation, walking inclines and declines, walking on uneven surfaces.   Relieving factors: sitting (but takes a couple of days for pain to ease off), Advil.    PRECAUTIONS: No known precautions.   RED FLAGS: Bowel or bladder incontinence: No, Cauda equina syndrome: No, and No unexplained changes in weight.      WEIGHT BEARING RESTRICTIONS: No  FALLS:  Has patient fallen in last 6 months? 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   PLOF: Independent  PATIENT GOALS: Pain to go away.   NEXT MD VISIT: none at this time.   OBJECTIVE:  Note: Objective measures were completed at Evaluation unless otherwise noted.  DIAGNOSTIC FINDINGS:  Had a CT scan and an x-ray which did not show anything.   Unable to get an MRI at the  moment.    PATIENT SURVEYS:  LEFS  Extreme difficulty/unable (0), Quite a bit of difficulty (1), Moderate difficulty (2), Little difficulty (3), No difficulty (4) Survey date:  11/06/2023  Any of your usual work, housework or school activities 3  2. Usual hobbies, recreational or sporting activities 3  3. Getting into/out of the bath 4  4. Walking between rooms 4  5. Putting on socks/shoes 4  6. Squatting  4  7. Lifting an object, like a bag of groceries from the floor 4  8. Performing light activities around your home 3  9. Performing heavy activities around your home 3  10. Getting into/out of a car 4  11. Walking 2 blocks 3  12. Walking 1 mile 3  13. Going up/down 10 stairs (1 flight) 3  14. Standing for 1 hour 3  15.  sitting for 1 hour 4  16. Running on even ground 3  17. Running on uneven ground 1  18. Making sharp turns while running fast 4  19. Hopping  4  20. Rolling over in bed 4  Score total:  68/80     COGNITION: Overall cognitive status: Within functional limits for tasks assessed     SENSATION: WFL  MUSCLE LENGTH:   POSTURE: R foot pronation, R lateral shift trunk, R tibial ER  PALPATION: TTP R toe extensor tendons 4th - 5th digit  No TTP to low back, no symptom reproduction    LUMBAR ROM:   AROM eval  Flexion WFL with L trunk rotation, R dorsal toe curling sensation all digits (none on L toes)  Extension WFL  Right lateral flexion WFL with R dorsal toe curling sensation all digits   Left lateral flexion WFL   Right rotation WFL with R dorsal toe curling sensation all digits   Left rotation WFL   (Blank rows = not tested)   Decreased toe curling symptoms with R plantar arch  supported for lumbar flexion, R side bend and R rotation.      LOWER EXTREMITY ROM:     Passive  Right eval Left eval  Hip flexion    Hip extension    Hip abduction    Hip adduction    Hip internal rotation    Hip external rotation    Knee flexion    Knee  extension    Ankle dorsiflexion    Ankle plantarflexion    Ankle inversion    Ankle eversion     (Blank rows = not tested)  LOWER EXTREMITY MMT:    MMT Right eval Left eval  Hip flexion 4 4-  Hip extension 4 4  Hip abduction 4 4-  Hip adduction    Hip internal rotation 4 (with R medial great toe symptoms) 4  Hip external rotation 4 4  Knee flexion 5 4  Knee extension 5 5  Ankle dorsiflexion (seated manually resisted) 4+ with symptoms   Ankle plantarflexion (seated manually resisted) 5   Ankle inversion (seated manually resisted) 4+   Ankle eversion (seated manually resisted) 4    (Blank rows = not tested)   R hamstrings in prone  Lateral 4/5  Medial 4-/5    LUMBAR SPECIAL TESTS:  (-) Slump R and L LE    FUNCTIONAL TESTS:    GAIT: Distance walked: 50 ft Assistive device utilized: None Level of assistance: Complete Independence Comments: decreased stance L LE, increased R foot pronation during stance phase  TREATMENT DATE: 11/06/2023                                                                                                                                Therapeutic exercise  Latex free band used   Pt was recommended to use arch supports to decrease R foot pronation secondary to improved symptoms when supported. PT verbalized understanding.   Seated R knee flexion targeting the medial hamstrings   Red band 10x3  Bridge 10x3    Improved exercise technique, movement at target joints, use of target muscles after mod verbal, visual, tactile cues.      PATIENT EDUCATION:  Education details: there-ex, HEP, POC Person educated: Patient Education method: Explanation, Demonstration, Tactile cues, Verbal cues, and Handouts Education comprehension: verbalized understanding and returned demonstration  HOME EXERCISE PROGRAM: Access Code: [email or web address] URL: [email or web address] Date: 11/06/2023 Prepared by: Emil Glassman  Exercises - Supine  Bridge  - 1 x daily - 7 x weekly - 3 sets - 10 reps  Seated R knee flexion targeting the medial hamstrings   Red latex free band 10x3    ASSESSMENT:  CLINICAL IMPRESSION: Patient is a 53  y.o. female who was seen today for physical therapy evaluation and treatment for R foot pain.  She also demonstrates altered gait pattern and posture, R foot pronation, trunk, B hip, and R medial hamstrings weakness, TTP R extensor digitorum tendons for 4th and 5th digits,  and difficulty performing weight bearing tasks as well as ambulation secondary to foot pain. Pt will benefit from skilled physical therapy services to address the aforementioned deficits.     OBJECTIVE IMPAIRMENTS: difficulty walking, decreased ROM, decreased strength, improper body mechanics, postural dysfunction, and pain.   ACTIVITY LIMITATIONS: carrying, lifting, standing, squatting, stairs, and locomotion level  PARTICIPATION LIMITATIONS:   PERSONAL FACTORS: Profession and Time since onset of injury/illness/exacerbation are also affecting patient's functional outcome.   REHAB POTENTIAL: Fair    CLINICAL DECISION MAKING: Stable/uncomplicated  EVALUATION COMPLEXITY: Low   GOALS: Goals reviewed with patient? Yes  SHORT TERM GOALS: Target date: 11/14/2023  Pt will be independent with her initial HEP to decrease pain, improve strength, function, and ability to perform standing tasks as well as ambulate more comfortably.  Baseline: Pt has started her initial HEP (11/06/2023) Goal status: INITIAL   LONG TERM GOALS: Target date: 01/02/2024  Pt will have a decrease in R dorsal lateral foot pain to 2/10 or less at worst to promote ability to ambulate, perform standing tasks for work more comfortably.  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  Pt will improve R LE strength by at least 1/2 MMT grade to  promote ability to perform standing tasks more comfortably for her foot.  Baseline:  MMT Right eval  Hip flexion 4  Hip extension 4  Hip abduction 4  Hip adduction   Hip internal rotation 4 (with R medial great toe symptoms)  Hip external rotation 4  Knee flexion 5  Knee extension 5  Ankle dorsiflexion (seated manually resisted) 4+ with symptoms  Ankle plantarflexion (seated manually resisted) 5  Ankle inversion (seated manually resisted) 4+  Ankle eversion (seated manually resisted) 4   Goal status: INITIAL  3.  Pt will improve her LEFS score by at least 10 points as a demonstration of improved function.  Baseline: 68/80 (11/06/2023) Goal status: INITIAL   PLAN:  PT FREQUENCY: 1-2x/week  PT DURATION: 8 weeks  PLANNED INTERVENTIONS: 97110-Therapeutic exercises, 97530- Therapeutic activity, 97112- Neuromuscular re-education, 97535- Self Care, [id number]- Manual therapy, [phone]- Gait training, [phone]- Aquatic Therapy, [phone]- Electrical stimulation (unattended), [phone]- Ionotophoresis 4mg /ml Dexamethasone, Patient/Family education, Joint mobilization, and Spinal mobilization.  PLAN FOR NEXT SESSION: medial hamstrings strengthening, trunk, glute, ankle strengthening, gentle toe extensor muscle loading, manual techniques, modalities PRN.    Caliann Leckrone, PT, DPT 11/06/2023, 4:49 PM

## 2023-11-12 ENCOUNTER — Ambulatory Visit

## 2023-11-18 ENCOUNTER — Ambulatory Visit

## 2023-11-18 ENCOUNTER — Ambulatory Visit (INDEPENDENT_AMBULATORY_CARE_PROVIDER_SITE_OTHER)

## 2023-11-18 DIAGNOSIS — M79672 Pain in left foot: Secondary | ICD-10-CM | POA: Diagnosis not present

## 2023-11-18 DIAGNOSIS — M25572 Pain in left ankle and joints of left foot: Secondary | ICD-10-CM

## 2023-11-18 DIAGNOSIS — S93402A Sprain of unspecified ligament of left ankle, initial encounter: Secondary | ICD-10-CM

## 2023-11-18 NOTE — Progress Notes (Signed)
  Subjective:  Patient ID: Meredith Whitaker, female    DOB: [birth year],  MRN: [id number]  No chief complaint on file.   53 y.o. female presents with complaint of painful ankle. Patient relates history of ankle inversion trauma that occurred on 11/09/23. Since that time, patient has attempted CAM Walker therapy with weightbearing.   She states that she is still in pain while walking.  She does relate to a history of ankle sprain on the contralateral limb. She was seen by another provider but switched to me due to insurance coverage. She is currently in PT.   Review of Systems: Negative except as noted in the HPI. Denies N/V/F/Ch.  Past Medical History:  Diagnosis Date   Cervical cancer (HCC)     Current Outpatient Medications:    Multiple Vitamin (MULTIVITAMIN) capsule, Take 1 capsule by mouth daily., Disp: , Rfl:    VITAMIN D PO, Take by mouth., Disp: , Rfl:   Social History   Tobacco Use  Smoking Status Never  Smokeless Tobacco Never    Allergies  Allergen Reactions   Tramadol     Other Reaction(s): Other (See Comments)  Other reaction(s): Other (See Comments)  Heart racing  Heart racing  Other reaction(s): Other (See Comments)  Heart racing   Codeine Rash   Oxycodone Rash   Objective:  There were no vitals filed for this visit. There is no height or weight on file to calculate BMI. Constitutional Well developed. Well nourished. Oriented to person, place, and time.  Vascular Dorsalis pedis pulses palpable bilaterally. Posterior tibial pulses palpable bilaterally. Capillary refill normal to all digits.  No cyanosis or clubbing noted. Pedal hair growth normal.  Neurologic Normal speech. Epicritic sensation to light touch grossly present bilaterally. Negative tinel sign at tarsal tunnel bilaterally.   Dermatologic Skin texture and turgor are within normal limits.  Open wounds and skin lesions absent. Skin tenting/prominent bone just deep to skin absent. Edema present  to the left ankle centered at lateral ligament complex.   Musculoskeletal: 5 out of 5 muscle strength to all major pedal muscle groups.  Minor cavus foot shape.  Hindfoot range of motion pain-free without limitations.  Ankle ROM and anterior drawer testing deferred due to recent trauma.  Pain to palpation of the lateral ankle ligament complex.  No pain to peroneals.  Pain with squeeze of tibia and fibula.   Radiographs: 3 views of the foot and 2 views of the ankle were taken today Taken and reviewed.  No acute osseous pathology is identified.  Mild pes cavus foot shape.  No fractures to the lateral malleolus, medial malleolus, posterior malleolus.  No syndesmotic widening.  Joints well-maintained.  Assessment:   1. Acute left ankle pain   2. Foot pain, left   3. Moderate left ankle sprain, initial encounter    Plan:  - Patient was evaluated and treated and all questions answered.  Left ankle sprain - Discussed the diagnosis of left ankle sprain with the patient.  We discussed that she can weight-bear as tolerated in a cam walker until she is able to walk pain-free.  At that time, she can transition to lace up ankle brace as tolerated. Continue PT.   - Weight bearing: As tolerated - Cast/boot/shoe: CAM Walker that she previously had   Return in about 3 weeks (around 12/09/2023).  Prentice Ovens, DPM AACFAS Fellowship Trained Podiatric Surgeon [facility]

## 2023-11-18 NOTE — Patient Instructions (Signed)
Ankle Sprain   An ankle sprain is a stretch or tear in a ligament in the ankle. Ligaments are tissues that connect bones to each other. The two most common types of ankle sprains are: Inversion sprain. This happens when the foot turns inward and the ankle rolls outward. It affects the ligament on the outside of the foot (lateral ligament). Eversion sprain. This happens when the foot turns outward and the ankle rolls inward. It affects the ligament on the inner side of the foot (medial ligament). What are the causes? This condition is often caused by accidentally rolling or twisting the ankle. What increases the risk? You are more likely to develop this condition if you play sports. What are the signs or symptoms?  Symptoms of this condition include: Pain in your ankle. Swelling. Bruising. This may develop right after you sprain your ankle or 1-2 days later. Trouble standing or walking, especially when you turn or change directions. How is this diagnosed? This condition is diagnosed with: A physical exam. During the exam, your health care provider will press on certain parts of your foot and ankle and try to move them in certain ways. X-ray imaging. These may be taken to see how severe the sprain is and to check for broken bones. How is this treated? This condition may be treated with: A brace or splint. This is used to keep the ankle from moving until it heals. An elastic bandage. This is used to support the ankle. Crutches. Pain medicine. Surgery. This may be needed if the sprain is severe. Physical therapy. This may help to improve the range of motion in the ankle. Follow these instructions at home: If you have a brace or a splint: Wear the brace or splint as told by your health care provider. Remove it only as told by your health care provider. Loosen the brace or splint if your toes tingle, become numb, or turn cold and blue. Keep the brace or splint clean. If the brace or  splint is not waterproof: Do not let it get wet. Cover it with a watertight covering when you take a bath or a shower. If you have an elastic bandage (dressing): Remove it to shower or bathe. Try not to move your ankle much, but wiggle your toes from time to time. This helps to prevent swelling. Adjust the dressing to make it more comfortable if it feels too tight. Loosen the dressing if you have numbness or tingling in your foot, or if your foot becomes cold and blue. Managing pain, stiffness, and swelling   Take over-the-counter and prescription medicines only as told by your health care provider. For 2-3 days, keep your ankle raised (elevated) above the level of your heart as much as possible. If directed, put ice on the injured area: If you have a removable brace or splint, remove it as told by your health care provider. Put ice in a plastic bag. Place a towel between your skin and the bag. Leave the ice on for 20 minutes, 2-3 times a day. General instructions Rest your ankle. Do not use the injured limb to support your body weight until your health care provider says that you can. Use crutches as told by your health care provider. Do not use any products that contain nicotine or tobacco, such as cigarettes, e-cigarettes, and chewing tobacco. If you need help quitting, ask your health care provider. Keep all follow-up visits as told by your health care provider. This is important. Contact a  health care provider if: You have rapidly increasing bruising or swelling. Your pain is not relieved with medicine. Get help right away if: Your foot or toes become numb or blue. You have severe pain that gets worse. Summary An ankle sprain is a stretch or tear in a ligament in the ankle. Ligaments are tissues that connect bones to each other. This condition is often caused by accidentally rolling or twisting the ankle. Symptoms include pain, swelling, bruising, and trouble walking. To relieve  pain and swelling, put ice on the affected ankle, raise your ankle above the level of your heart, and use an elastic bandage. Keep all follow-up visits as told by your health care provider. This is important. This information is not intended to replace advice given to you by your health care provider. Make sure you discuss any questions you have with your health care provider. Document Revised: 11/10/2017 Document Reviewed: 07/15/2017 Elsevier Patient Education  [place].  Ankle Sprain, Phase I Rehab An ankle sprain is an injury to the ligaments of your ankle. Ankle sprains cause stiffness, loss of motion, and loss of strength. Ask your health care provider which exercises are safe for you. Do exercises exactly as told by your health care provider and adjust them as directed. It is normal to feel mild stretching, pulling, tightness, or discomfort as you do these exercises. Stop right away if you feel sudden pain or your pain gets worse. Do not begin these exercises until told by your health care provider. Stretching and range-of-motion exercises These exercises warm up your muscles and joints and improve the movement and flexibility of your lower leg and ankle. These exercises also help to relieve pain and stiffness. Gastroc and soleus stretch  This exercise is also called a calf stretch. It stretches the muscles in the back of the lower leg. These muscles are the gastrocnemius, or gastroc, and the soleus. Sit on the floor with your left / right leg extended. Loop a belt or towel around the ball of your left / right foot. The ball of your foot is on the walking surface, right under your toes. Keep your left / right ankle and foot relaxed and keep your knee straight while you use the belt or towel to pull your foot toward you. You should feel a gentle stretch behind your calf or knee in your gastroc muscle. Hold this position for 15 seconds, then release to the starting position. Repeat the  exercise with your knee bent. You can put a pillow or a rolled bath towel under your knee to support it. You should feel a stretch deep in your calf in the soleus muscle or at your Achilles tendon. Repeat 5 times. Complete this exercise 2 times a day. Ankle alphabet   Sit with your left / right leg supported at the lower leg. Do not rest your foot on anything. Make sure your foot has room to move freely. Think of your left / right foot as a paintbrush. Move your foot to trace each letter of the alphabet in the air. Keep your hip and knee still while you trace. Make the letters as large as you can without feeling discomfort. Trace every letter from A to Z. Repeat 5 times. Complete this exercise 2 times a day. Strengthening exercises These exercises build strength and endurance in your ankle and lower leg. Endurance is the ability to use your muscles for a long time, even after they get tired. Ankle dorsiflexion   Secure  a rubber exercise band or tube to an object, such as a table leg, that will stay still when the band is pulled. Secure the other end around your left / right foot. Sit on the floor facing the object, with your left / right leg extended. The band or tube should be slightly tense when your foot is relaxed. Slowly bring your foot toward you, bringing the top of your foot toward your shin (dorsiflexion), and pulling the band tighter. Hold this position for 15 seconds. Slowly return your foot to the starting position. Repeat 5 times. Complete this exercise 2 times a day. Ankle plantar flexion   Sit on the floor with your left / right leg extended. Loop a rubber exercise tube or band around the ball of your left / right foot. The ball of your foot is on the walking surface, right under your toes. Hold the ends of the band or tube in your hands. The band or tube should be slightly tense when your foot is relaxed. Slowly point your foot and toes downward to tilt the top of your  foot away from your shin (plantar flexion). Hold this position for 15 seconds. Slowly return your foot to the starting position. Repeat 5 times. Complete this exercise 2times a day. Ankle eversion Sit on the floor with your legs straight out in front of you. Loop a rubber exercise band or tube around the ball of your left / right foot. The ball of your foot is on the walking surface, right under your toes. Hold the ends of the band in your hands, or secure the band to a stable object. The band or tube should be slightly tense when your foot is relaxed. Slowly push your foot outward, away from your other leg (eversion). Hold this position for 15 seconds. Slowly return your foot to the starting position. Repeat __________ times. Complete this exercise __________ times a day. This information is not intended to replace advice given to you by your health care provider. Make sure you discuss any questions you have with your health care provider. Document Revised: 06/09/2018 Document Reviewed: 12/01/2017 Elsevier Patient Education  2020 [employer].

## 2023-11-20 ENCOUNTER — Ambulatory Visit

## 2023-11-25 ENCOUNTER — Ambulatory Visit

## 2023-11-25 DIAGNOSIS — R262 Difficulty in walking, not elsewhere classified: Secondary | ICD-10-CM

## 2023-11-25 DIAGNOSIS — M79671 Pain in right foot: Secondary | ICD-10-CM

## 2023-11-25 NOTE — Therapy (Signed)
 OUTPATIENT PHYSICAL THERAPY  TREATMENT   Patient Name: QUANISHA DREWRY MRN: [id number] DOB:September 22, 1970, 53 y.o., female Today's Date: 11/25/2023  END OF SESSION:  PT End of Session - 11/25/23 1520     Visit Number 2    Number of Visits 17    Date for Recertification  01/02/24    PT Start Time 1520    PT Stop Time 1605    PT Time Calculation (min) 45 min    Activity Tolerance Patient tolerated treatment well    Behavior During Therapy [facility] for tasks assessed/performed           Past Medical History:  Diagnosis Date   Cervical cancer (HCC)    Past Surgical History:  Procedure Laterality Date   ABDOMINAL HYSTERECTOMY     ROTATOR CUFF REPAIR Right    Patient Active Problem List   Diagnosis Date Noted   Carpal tunnel syndrome of right wrist 07/20/2014   Pain in thumb joint with movement of right hand 07/07/2014   Numbness and tingling 07/07/2014   Thumb weakness 07/07/2014   Postoperative neuritis of right upper extremity 06/08/2014   Adhesive capsulitis of right shoulder 05/11/2014   Medial epicondylitis of right elbow 04/18/2014   Incomplete tear of right rotator cuff 03/02/2014   Bilious vomiting with nausea 02/04/2014   Biceps tendonitis on right 07/15/2013   Cervical radiculopathy 07/15/2013   SLAP (superior glenoid labrum lesion) 07/15/2013   Subacromial tendonitis 07/15/2013    PCP:   Trudy Dorn BRAVO, MD    REFERRING PROVIDER: Verta Royden DASEN, DPM  REFERRING DIAG: Diagnosis G57.81 (ICD-10-CM) - 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 Has a high ankle sprain. No fracture, went to [facility] and had X-rays.  R foot bothered her Friday night, had to work Friday all day on her feet. Works at the [employer] and only has 2 more races to go. No R foot pain currently. L ankle pain located at lateral and at distal talofibuar joint, 5/10 currently. Currently wearing her CAM boot for 4-8 more weeks.    PERTINENT  HISTORY:  R foot pain. Pain is located at R 4th and 5th digits and goes to the R 5th metatarsal area. Pain comes and goes. Pain started last year, gradual onset with worsening pain resulting in visit to Dr. Verta in February 2025. Chaning the size and width of her shoe did not help, nor ice or heat. Pain wakes her up at night.     Blood pressure is controlled per pt.   Latex bands should be fine per pt.     PAIN:  Are you having pain? Yes: NPRS scale: 0/10 (in sitting and walking from waiting room to treatment room, took Advil this morning.  Pain location: R lateral foot (digits 4-5) Pain description: sharp, sore, pins and needles, achy Aggravating factors: walking, being on her feet a lot all day, stair negotiation, walking inclines and declines, walking on uneven surfaces.   Relieving factors: sitting (but takes a couple of days for pain to ease off), Advil.    PRECAUTIONS: No known precautions.   RED FLAGS: Bowel or bladder incontinence: No, Cauda equina syndrome: No, and No unexplained changes in weight.      WEIGHT BEARING RESTRICTIONS: No  FALLS:  Has patient fallen in last 6 months? 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   PLOF: Independent  PATIENT GOALS: Pain to go away.    NEXT MD VISIT: none at this time.   OBJECTIVE:  Note: Objective measures were completed at Evaluation unless otherwise noted.  DIAGNOSTIC FINDINGS:  Had a CT scan and an x-ray which did not show anything.   Unable to get an MRI at the moment.    PATIENT SURVEYS:  LEFS  Extreme difficulty/unable (0), Quite a bit of difficulty (1), Moderate difficulty (2), Little difficulty (3), No difficulty (4) Survey date:  11/06/2023  Any of your usual work, housework or school activities 3  2. Usual hobbies, recreational or sporting activities 3  3. Getting into/out of the bath 4  4. Walking between rooms 4  5. Putting on socks/shoes 4  6. Squatting  4  7. Lifting an object, like a bag of groceries from the floor 4  8. Performing light activities around your home 3  9. Performing heavy activities around your home 3  10. Getting into/out of a car 4  11. Walking 2 blocks 3  12. Walking 1 mile 3  13. Going up/down 10 stairs (1 flight) 3  14. Standing for 1 hour 3  15.  sitting for 1 hour 4  16. Running on even ground 3  17. Running on uneven ground 1  18. Making sharp turns while running fast 4  19. Hopping  4  20. Rolling over in bed 4  Score total:  68/80     COGNITION: Overall cognitive status: Within functional limits for tasks assessed     SENSATION: WFL  MUSCLE LENGTH:   POSTURE: R foot pronation, R lateral shift trunk, R tibial ER  PALPATION: TTP R toe extensor tendons 4th - 5th digit  No TTP to low back, no symptom reproduction    LUMBAR ROM:   AROM eval  Flexion WFL with L trunk rotation, R dorsal toe curling sensation all digits (none on L toes)  Extension WFL  Right lateral flexion  WFL with R dorsal toe curling sensation all digits   Left lateral flexion WFL   Right rotation [facility] with R dorsal toe curling sensation all digits   Left rotation WFL   (Blank rows = not tested)   Decreased toe curling symptoms with R plantar arch supported for lumbar flexion,  R side bend and R rotation.      LOWER EXTREMITY ROM:     Passive  Right eval Left eval  Hip flexion    Hip extension    Hip abduction    Hip adduction    Hip internal rotation    Hip external rotation    Knee flexion    Knee extension    Ankle dorsiflexion    Ankle plantarflexion    Ankle inversion    Ankle eversion     (Blank rows = not tested)  LOWER EXTREMITY MMT:    MMT Right eval Left eval  Hip flexion 4 4-  Hip extension 4 4  Hip abduction 4 4-  Hip adduction    Hip internal rotation 4 (with R medial great toe symptoms) 4  Hip external rotation 4 4  Knee flexion 5 4  Knee extension 5 5  Ankle dorsiflexion (seated manually resisted) 4+ with symptoms   Ankle plantarflexion (seated manually resisted) 5   Ankle inversion (seated manually resisted) 4+   Ankle eversion (seated manually resisted) 4    (Blank rows = not tested)   R hamstrings in prone  Lateral 4/5  Medial 4-/5    LUMBAR SPECIAL TESTS:  (-) Slump R and L LE    FUNCTIONAL TESTS:    GAIT: Distance walked: 50 ft Assistive device utilized: None Level of assistance: Complete Independence Comments: decreased stance L LE, increased R foot pronation during stance phase  TREATMENT DATE: 11/25/2023                                                                                                                                Therapeutic exercise  Latex free band used  With CAM boot on L   Seated R knee flexion targeting the medial hamstrings   Red band 10x3  Bridge 10x  L ankle discomfort  Quadruped   Hip extension    R 5x5 seconds   Easy per pt  Standing split squat with contralateral UE assist   L leg propped on stool to take pressure off the L ankle  R 10x3  Seated R manually resisted toe extension isometrics 10x5 seconds for 3 sets  Increased R web space numbness at 4th and 5th digits aftewrards  Seated hip adduction isometrics 10x5 seconds   Supine hip extension  isometrics, leg straight  R 2x5 seconds. L back cramp,   Supine SKTC   L 2x5 seconds, decreased back cramp but increased posterior thigh cramp   Improved exercise technique, movement at target joints, use of target muscles after mod verbal, visual, tactile cues.  Neuromuscular re education   Prone on elbow x 2 minutes for 2 sets, L foot and ankle propped on folded pillow.   Decreased R lateral foot numbness  Prone press-ups 5x5 seconds   Sitting with upright posture  Decreased R lateral foot symptoms.   Reviewed ergonomic lifting. Pt verbalized understanding.     Improved technique, movement at target joints, use of target muscles after mod verbal, visual, tactile cues.      PATIENT EDUCATION:  Education details: there-ex, HEP, POC Person educated: Patient Education method: Explanation, Demonstration, Tactile cues, Verbal cues, and Handouts Education comprehension: verbalized understanding and returned demonstration  HOME EXERCISE PROGRAM: Access Code: [email or web address] URL: [email or web address] Date: 11/06/2023 Prepared by: Emil Glassman  Exercises - Supine Bridge  - 1 x daily - 7 x weekly - 3 sets - 10 reps  Seated R knee flexion targeting the medial hamstrings   Red latex free band 10x3  - Prone on Elbows Stretch  - 2 x daily - 7 x weekly - 1 sets - 2 reps - 2 minutes hold  ASSESSMENT:  CLINICAL IMPRESSION: Decreased L lateral foot numbness with treatment to promote gentle lumbar extension and upright posture. Provided prone on elbows as part of her HEP, reviewed ergonomics, as well as recommended upright posture to help decrease stress to low back nerves. Pt demonstrated and verbalized understanding. Pt will benefit from continued skilled physical therapy services to decrease pain, improve strength and function.      OBJECTIVE IMPAIRMENTS: difficulty walking, decreased ROM, decreased strength, improper body mechanics, postural dysfunction, and pain.    ACTIVITY LIMITATIONS: carrying, lifting, standing, squatting, stairs, and locomotion level  PARTICIPATION LIMITATIONS:   PERSONAL FACTORS: Profession and Time since onset of injury/illness/exacerbation are also affecting patient's functional outcome.   REHAB POTENTIAL: Fair    CLINICAL DECISION MAKING: Stable/uncomplicated  EVALUATION COMPLEXITY: Low   GOALS: Goals reviewed with patient? Yes  SHORT TERM GOALS: Target date: 11/14/2023  Pt will be independent with her initial HEP to decrease pain, improve strength, function, and ability to perform standing tasks as well as ambulate more comfortably.  Baseline: Pt has started her initial HEP (11/06/2023) Goal status: INITIAL   LONG TERM GOALS: Target date: 01/02/2024  Pt will have a decrease in R dorsal lateral foot pain to 2/10 or less at worst to promote ability to ambulate, perform standing tasks for work more comfortably.  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  Pt will improve R LE strength by at least 1/2 MMT grade to promote ability to perform standing tasks more comfortably for her foot.  Baseline:  MMT Right eval  Hip flexion 4  Hip extension 4  Hip abduction 4  Hip adduction   Hip internal rotation 4 (with R medial great toe symptoms)  Hip external rotation 4  Knee flexion 5  Knee extension 5  Ankle dorsiflexion (seated manually resisted) 4+ with symptoms  Ankle plantarflexion (seated manually resisted) 5  Ankle inversion (seated manually resisted) 4+  Ankle eversion (seated manually resisted) 4   Goal status: INITIAL  3.  Pt will improve her LEFS score by at least 10 points as a demonstration of improved function.  Baseline: 68/80 (11/06/2023) Goal status: INITIAL   PLAN:  PT FREQUENCY: 1-2x/week  PT DURATION: 8 weeks  PLANNED INTERVENTIONS: 97110-Therapeutic exercises,  97530- Therapeutic activity, [id number]- Neuromuscular re-education, 97535-  Self Care, 02859- Manual therapy, [phone]- Gait training, [phone]- Aquatic Therapy, [phone]- Electrical stimulation (unattended), [phone]- Ionotophoresis 4mg /ml Dexamethasone, Patient/Family education, Joint mobilization, and Spinal mobilization.  PLAN FOR NEXT SESSION: medial hamstrings strengthening, trunk, glute, ankle strengthening, gentle toe extensor muscle loading, manual techniques, modalities PRN.    Arlind Klingerman, PT, DPT 11/25/2023, 4:09 PM

## 2023-11-27 ENCOUNTER — Ambulatory Visit

## 2023-11-27 DIAGNOSIS — M79671 Pain in right foot: Secondary | ICD-10-CM | POA: Diagnosis not present

## 2023-11-27 DIAGNOSIS — R262 Difficulty in walking, not elsewhere classified: Secondary | ICD-10-CM

## 2023-11-27 NOTE — Therapy (Signed)
 OUTPATIENT PHYSICAL THERAPY  TREATMENT   Patient Name: Meredith Whitaker MRN: [id number] DOB:June 21, 1970, 53 y.o., female Today's Date: 11/27/2023  END OF SESSION:  PT End of Session - 11/27/23 1607     Visit Number 3    Number of Visits 17    Date for Recertification  01/02/24    PT Start Time 1520    PT Stop Time 1611    PT Time Calculation (min) 51 min    Activity Tolerance Patient tolerated treatment well    Behavior During Therapy [facility] for tasks assessed/performed            Past Medical History:  Diagnosis Date   Cervical cancer (HCC)    Past Surgical History:  Procedure Laterality Date   ABDOMINAL HYSTERECTOMY     ROTATOR CUFF REPAIR Right    Patient Active Problem List   Diagnosis Date Noted   Carpal tunnel syndrome of right wrist 07/20/2014   Pain in thumb joint with movement of right hand 07/07/2014   Numbness and tingling 07/07/2014   Thumb weakness 07/07/2014   Postoperative neuritis of right upper extremity 06/08/2014   Adhesive capsulitis of right shoulder 05/11/2014   Medial epicondylitis of right elbow 04/18/2014   Incomplete tear of right rotator cuff 03/02/2014   Bilious vomiting with nausea 02/04/2014   Biceps tendonitis on right 07/15/2013   Cervical radiculopathy 07/15/2013   SLAP (superior glenoid labrum lesion) 07/15/2013   Subacromial tendonitis 07/15/2013    PCP:   Trudy Dorn BRAVO, MD    REFERRING PROVIDER: Verta Royden DASEN, DPM  REFERRING DIAG: Diagnosis 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 No R foot pain currently       PERTINENT HISTORY:  R foot pain. Pain is located at R 4th and 5th digits and goes to the R 5th metatarsal area. Pain comes and goes. Pain started last year, gradual onset with worsening pain resulting in visit to Dr. Verta in February 2025. Chaning the size and width of her shoe did not help, nor ice or heat. Pain wakes her up  at night.     Blood pressure is controlled per pt.   Latex bands should be fine per pt.     PAIN:  Are you having pain? Yes: NPRS scale: 0/10 (in sitting and walking from waiting room to treatment room, took Advil this morning.  Pain location: R lateral foot (digits 4-5) Pain description: sharp, sore, pins and needles, achy Aggravating factors: walking, being on her feet a lot all day, stair negotiation, walking inclines and declines, walking on uneven surfaces.   Relieving factors: sitting (but takes a couple of days for pain to ease off), Advil.    PRECAUTIONS: No known precautions.   RED FLAGS: Bowel or bladder incontinence: No, Cauda equina syndrome: No, and No unexplained changes in weight.      WEIGHT BEARING RESTRICTIONS: No  FALLS:  Has patient fallen in last 6 months? 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   PLOF: Independent  PATIENT GOALS: Pain to go away.   NEXT MD VISIT: none at this time.   OBJECTIVE:  Note: Objective measures were completed at Evaluation unless otherwise noted.  DIAGNOSTIC FINDINGS:  Had a CT scan and an x-ray which did not show anything.   Unable to get an MRI at the moment.    PATIENT SURVEYS:  LEFS  Extreme difficulty/unable (0),  Quite a bit of difficulty (1), Moderate difficulty (2), Little difficulty (3), No difficulty (4) Survey date:  11/06/2023  Any of your usual work, housework or school activities 3  2. Usual hobbies, recreational or sporting activities 3  3. Getting into/out of the bath 4  4. Walking between rooms 4  5. Putting on socks/shoes 4  6. Squatting  4  7. Lifting an object, like a bag of groceries from the floor 4  8. Performing light activities around your home 3  9. Performing heavy activities around your home 3  10. Getting into/out of a car 4  11. Walking 2 blocks 3  12. Walking 1 mile 3  13. Going up/down 10 stairs (1 flight) 3  14. Standing for 1 hour 3  15.  sitting for 1 hour 4  16. Running on even ground 3  17. Running on uneven ground 1  18. Making sharp turns while running fast 4  19. Hopping  4  20. Rolling over in bed 4  Score total:  68/80     COGNITION: Overall cognitive status: Within functional limits for tasks assessed     SENSATION: WFL  MUSCLE LENGTH:   POSTURE: R foot pronation, R lateral shift trunk, R tibial ER  PALPATION: TTP R toe extensor tendons 4th - 5th digit  No TTP to low back, no symptom reproduction    LUMBAR ROM:   AROM eval  Flexion WFL with L trunk rotation, R dorsal toe curling sensation all digits (none on L toes)  Extension WFL  Right lateral flexion WFL with R dorsal toe curling sensation all digits   Left lateral flexion WFL   Right rotation WFL with R dorsal toe curling sensation all digits   Left rotation WFL   (Blank rows = not tested)   Decreased toe curling symptoms with R plantar arch supported for lumbar flexion, R side bend and R rotation.  LOWER EXTREMITY ROM:     Passive  Right eval Left eval  Hip flexion    Hip extension    Hip abduction    Hip adduction    Hip internal rotation 27   Hip external rotation    Knee flexion    Knee extension    Ankle dorsiflexion    Ankle plantarflexion    Ankle  inversion    Ankle eversion     (Blank rows = not tested)  LOWER EXTREMITY MMT:    MMT Right eval Left eval  Hip flexion 4 4-  Hip extension 4 4  Hip abduction 4 4-  Hip adduction    Hip internal rotation 4 (with R medial great toe symptoms) 4  Hip external rotation 4 4  Knee flexion 5 4  Knee extension 5 5  Ankle dorsiflexion (seated manually resisted) 4+ with symptoms   Ankle plantarflexion (seated manually resisted) 5   Ankle inversion (seated manually resisted) 4+   Ankle eversion (seated manually resisted) 4    (Blank rows = not tested)   R hamstrings in prone  Lateral 4/5  Medial 4-/5    LUMBAR SPECIAL TESTS:  (-) Slump R and L LE  R piriformis test: R posterior thigh pulling around the sciatic nerve   FUNCTIONAL TESTS:    GAIT: Distance walked: 50 ft Assistive device utilized: None Level of assistance: Complete Independence Comments: decreased stance L LE, increased R foot pronation during stance phase  TREATMENT DATE: 11/27/2023                                                                                                                                Therapeutic activities  Performed with the intent on improving ability to perform standing tasks at work with less R foot symptoms.   Latex free band used  With CAM boot on L   Side stepping 30 ft to the R and 30 ft to the L. Easy  Then with red band around knees 1x  Standing B shoulder extension red band with scapular retraction to promote thoracic extension and trunk strengthening   5x5 seconds,   Then 10x5 seconds for 2 sets  Standing split squat with contralateral UE assist   L leg propped on stool to take pressure off the L ankle  R 10x  L leg discomfort  Supine bridge with L LE propped on stool for comfort and to target R glute max muscle 10x3  Seated LE neural flossing  R 10x3  S/L hip abduction   R 10x, then 6x5 seconds    R lateral toe tingling   L 10x5 seconds   Prone on  elbow 2 minutes L foot and ankle propped on folded pillow.   Decreased R lateral foot tingling.   Supine Piriformis stretch 30 seconds x 3 for R  Decreased R foot paresthesia  Seated hip adduction isometrics folded pillow squeeze 10x5 seconds for  2 sets     Improved exercise technique, movement at target joints, use of target muscles after mod verbal, visual, tactile cues.       PATIENT EDUCATION:  Education details: there-ex, HEP, POC Person educated: Patient Education method: Explanation, Demonstration, Tactile cues, Verbal cues, and Handouts Education comprehension: verbalized understanding and returned demonstration  HOME EXERCISE PROGRAM: Access Code: [email or web address] URL: [email or web address] Date: 11/06/2023 Prepared by: Emil Glassman  Exercises - Supine Bridge  - 1 x daily - 7 x weekly - 3 sets - 10 reps  Seated R knee flexion targeting the medial hamstrings   Red latex free band 10x3  - Prone on Elbows Stretch  - 2 x daily - 7 x weekly - 1 sets - 2 reps - 2 minutes hold  - LE neural flossing  - 1 x daily - 7 x weekly - 3 sets - 10 reps - Supine Piriformis Stretch  - 3 x daily - 7 x weekly - 1 sets - 5 reps - 1 minute  hold - Seated Hip Adduction Isometrics with Ball  - 1 x daily - 7 x weekly - 3 sets - 10 reps - 5 seconds hold     ASSESSMENT:  CLINICAL IMPRESSION: Increased R lateral foot paresthesia with S/L R hip abduction. Decreased R lateral foot paresthesia with treatment to decrease R piriformis muscle tension on her LE nerves as well as with lumbar extension secondary to directional preference. Worked on glute med, max and trunk strengthening to help decrease pressure to low back nerves when performing standing tasks at work. Pt will benefit from continued skilled physical therapy services to decrease pain, improve strength and function.      OBJECTIVE IMPAIRMENTS: difficulty walking, decreased ROM, decreased strength, improper body mechanics,  postural dysfunction, and pain.   ACTIVITY LIMITATIONS: carrying, lifting, standing, squatting, stairs, and locomotion level  PARTICIPATION LIMITATIONS:   PERSONAL FACTORS: Profession and Time since onset of injury/illness/exacerbation are also affecting patient's functional outcome.   REHAB POTENTIAL: Fair    CLINICAL DECISION MAKING: Stable/uncomplicated  EVALUATION COMPLEXITY: Low   GOALS: Goals reviewed with patient? Yes  SHORT TERM GOALS: Target date: 11/14/2023  Pt will be independent with her initial HEP to decrease pain, improve strength, function, and ability to perform standing tasks as well as ambulate more comfortably.  Baseline: Pt has started her initial HEP (11/06/2023) Goal status: INITIAL   LONG TERM GOALS: Target date: 01/02/2024  Pt will have a decrease in R dorsal lateral foot pain to 2/10 or less at worst to promote ability to ambulate, perform standing tasks for work more comfortably.  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  Pt will improve R LE strength by at least 1/2 MMT grade to promote ability to perform standing tasks more comfortably for her foot.  Baseline:  MMT Right eval  Hip flexion 4  Hip extension 4  Hip abduction 4  Hip adduction   Hip internal rotation 4 (with R medial great toe symptoms)  Hip external rotation 4  Knee flexion 5  Knee extension 5  Ankle dorsiflexion (seated manually resisted) 4+ with symptoms  Ankle plantarflexion (seated manually resisted) 5  Ankle inversion (seated manually resisted) 4+  Ankle eversion (seated manually resisted) 4   Goal status: INITIAL  3.  Pt will improve her LEFS score by at least 10 points as  a demonstration of improved function.  Baseline: 68/80 (11/06/2023) Goal status: INITIAL   PLAN:  PT FREQUENCY: 1-2x/week  PT DURATION: 8 weeks  PLANNED INTERVENTIONS:  97110-Therapeutic exercises, 97530- Therapeutic activity, 97112- Neuromuscular re-education, 97535- Self Care, 02859- Manual therapy, [phone]- Gait training, [phone]- Aquatic Therapy, [phone]- Electrical stimulation (unattended), [phone]- Ionotophoresis 4mg /ml Dexamethasone, Patient/Family education, Joint mobilization, and Spinal mobilization.  PLAN FOR NEXT SESSION: medial hamstrings strengthening, trunk, glute, ankle strengthening, gentle toe extensor muscle loading, manual techniques, modalities PRN.    Ekaterini Capitano, PT, DPT 11/27/2023, 4:14 PM

## 2023-12-02 ENCOUNTER — Ambulatory Visit

## 2023-12-04 ENCOUNTER — Ambulatory Visit

## 2023-12-09 ENCOUNTER — Ambulatory Visit

## 2023-12-10 ENCOUNTER — Ambulatory Visit (INDEPENDENT_AMBULATORY_CARE_PROVIDER_SITE_OTHER)

## 2023-12-10 DIAGNOSIS — M7989 Other specified soft tissue disorders: Secondary | ICD-10-CM | POA: Diagnosis not present

## 2023-12-10 DIAGNOSIS — S93402A Sprain of unspecified ligament of left ankle, initial encounter: Secondary | ICD-10-CM

## 2023-12-10 DIAGNOSIS — G5781 Other specified mononeuropathies of right lower limb: Secondary | ICD-10-CM | POA: Diagnosis not present

## 2023-12-10 DIAGNOSIS — M79672 Pain in left foot: Secondary | ICD-10-CM | POA: Diagnosis not present

## 2023-12-10 NOTE — Patient Instructions (Signed)
Ankle Sprain   An ankle sprain is a stretch or tear in a ligament in the ankle. Ligaments are tissues that connect bones to each other. The two most common types of ankle sprains are: Inversion sprain. This happens when the foot turns inward and the ankle rolls outward. It affects the ligament on the outside of the foot (lateral ligament). Eversion sprain. This happens when the foot turns outward and the ankle rolls inward. It affects the ligament on the inner side of the foot (medial ligament). What are the causes? This condition is often caused by accidentally rolling or twisting the ankle. What increases the risk? You are more likely to develop this condition if you play sports. What are the signs or symptoms?  Symptoms of this condition include: Pain in your ankle. Swelling. Bruising. This may develop right after you sprain your ankle or 1-2 days later. Trouble standing or walking, especially when you turn or change directions. How is this diagnosed? This condition is diagnosed with: A physical exam. During the exam, your health care provider will press on certain parts of your foot and ankle and try to move them in certain ways. X-ray imaging. These may be taken to see how severe the sprain is and to check for broken bones. How is this treated? This condition may be treated with: A brace or splint. This is used to keep the ankle from moving until it heals. An elastic bandage. This is used to support the ankle. Crutches. Pain medicine. Surgery. This may be needed if the sprain is severe. Physical therapy. This may help to improve the range of motion in the ankle. Follow these instructions at home: If you have a brace or a splint: Wear the brace or splint as told by your health care provider. Remove it only as told by your health care provider. Loosen the brace or splint if your toes tingle, become numb, or turn cold and blue. Keep the brace or splint clean. If the brace or  splint is not waterproof: Do not let it get wet. Cover it with a watertight covering when you take a bath or a shower. If you have an elastic bandage (dressing): Remove it to shower or bathe. Try not to move your ankle much, but wiggle your toes from time to time. This helps to prevent swelling. Adjust the dressing to make it more comfortable if it feels too tight. Loosen the dressing if you have numbness or tingling in your foot, or if your foot becomes cold and blue. Managing pain, stiffness, and swelling   Take over-the-counter and prescription medicines only as told by your health care provider. For 2-3 days, keep your ankle raised (elevated) above the level of your heart as much as possible. If directed, put ice on the injured area: If you have a removable brace or splint, remove it as told by your health care provider. Put ice in a plastic bag. Place a towel between your skin and the bag. Leave the ice on for 20 minutes, 2-3 times a day. General instructions Rest your ankle. Do not use the injured limb to support your body weight until your health care provider says that you can. Use crutches as told by your health care provider. Do not use any products that contain nicotine or tobacco, such as cigarettes, e-cigarettes, and chewing tobacco. If you need help quitting, ask your health care provider. Keep all follow-up visits as told by your health care provider. This is important. Contact a  health care provider if: You have rapidly increasing bruising or swelling. Your pain is not relieved with medicine. Get help right away if: Your foot or toes become numb or blue. You have severe pain that gets worse. Summary An ankle sprain is a stretch or tear in a ligament in the ankle. Ligaments are tissues that connect bones to each other. This condition is often caused by accidentally rolling or twisting the ankle. Symptoms include pain, swelling, bruising, and trouble walking. To relieve  pain and swelling, put ice on the affected ankle, raise your ankle above the level of your heart, and use an elastic bandage. Keep all follow-up visits as told by your health care provider. This is important. This information is not intended to replace advice given to you by your health care provider. Make sure you discuss any questions you have with your health care provider. Document Revised: 11/10/2017 Document Reviewed: 07/15/2017 Elsevier Patient Education  [place].  Ankle Sprain, Phase I Rehab An ankle sprain is an injury to the ligaments of your ankle. Ankle sprains cause stiffness, loss of motion, and loss of strength. Ask your health care provider which exercises are safe for you. Do exercises exactly as told by your health care provider and adjust them as directed. It is normal to feel mild stretching, pulling, tightness, or discomfort as you do these exercises. Stop right away if you feel sudden pain or your pain gets worse. Do not begin these exercises until told by your health care provider. Stretching and range-of-motion exercises These exercises warm up your muscles and joints and improve the movement and flexibility of your lower leg and ankle. These exercises also help to relieve pain and stiffness. Gastroc and soleus stretch  This exercise is also called a calf stretch. It stretches the muscles in the back of the lower leg. These muscles are the gastrocnemius, or gastroc, and the soleus. Sit on the floor with your left / right leg extended. Loop a belt or towel around the ball of your left / right foot. The ball of your foot is on the walking surface, right under your toes. Keep your left / right ankle and foot relaxed and keep your knee straight while you use the belt or towel to pull your foot toward you. You should feel a gentle stretch behind your calf or knee in your gastroc muscle. Hold this position for 15 seconds, then release to the starting position. Repeat the  exercise with your knee bent. You can put a pillow or a rolled bath towel under your knee to support it. You should feel a stretch deep in your calf in the soleus muscle or at your Achilles tendon. Repeat 5 times. Complete this exercise 2 times a day. Ankle alphabet   Sit with your left / right leg supported at the lower leg. Do not rest your foot on anything. Make sure your foot has room to move freely. Think of your left / right foot as a paintbrush. Move your foot to trace each letter of the alphabet in the air. Keep your hip and knee still while you trace. Make the letters as large as you can without feeling discomfort. Trace every letter from A to Z. Repeat 5 times. Complete this exercise 2 times a day. Strengthening exercises These exercises build strength and endurance in your ankle and lower leg. Endurance is the ability to use your muscles for a long time, even after they get tired. Ankle dorsiflexion   Secure  a rubber exercise band or tube to an object, such as a table leg, that will stay still when the band is pulled. Secure the other end around your left / right foot. Sit on the floor facing the object, with your left / right leg extended. The band or tube should be slightly tense when your foot is relaxed. Slowly bring your foot toward you, bringing the top of your foot toward your shin (dorsiflexion), and pulling the band tighter. Hold this position for 15 seconds. Slowly return your foot to the starting position. Repeat 5 times. Complete this exercise 2 times a day. Ankle plantar flexion   Sit on the floor with your left / right leg extended. Loop a rubber exercise tube or band around the ball of your left / right foot. The ball of your foot is on the walking surface, right under your toes. Hold the ends of the band or tube in your hands. The band or tube should be slightly tense when your foot is relaxed. Slowly point your foot and toes downward to tilt the top of your  foot away from your shin (plantar flexion). Hold this position for 15 seconds. Slowly return your foot to the starting position. Repeat 5 times. Complete this exercise 2times a day. Ankle eversion Sit on the floor with your legs straight out in front of you. Loop a rubber exercise band or tube around the ball of your left / right foot. The ball of your foot is on the walking surface, right under your toes. Hold the ends of the band in your hands, or secure the band to a stable object. The band or tube should be slightly tense when your foot is relaxed. Slowly push your foot outward, away from your other leg (eversion). Hold this position for 15 seconds. Slowly return your foot to the starting position. Repeat __________ times. Complete this exercise __________ times a day. This information is not intended to replace advice given to you by your health care provider. Make sure you discuss any questions you have with your health care provider. Document Revised: 06/09/2018 Document Reviewed: 12/01/2017 Elsevier Patient Education  2020 [employer].

## 2023-12-10 NOTE — Progress Notes (Signed)
 Subjective:  Patient ID: Meredith Whitaker, female    DOB: [birth year],  MRN: [id number]  Chief Complaint  Patient presents with   Foot Pain    Ankle is still bothering me. PT not working. Ankle is not betting any better    53 y.o. female presents with complaint of painful left ankle. Patient relates history of ankle inversion trauma that occurred on 11/09/23. Since that time, patient had attempted CAM Walker therapy with weightbearing, she then transitioned into a lace up ankle brace. She states that she is still in pain while walking. The brace feels better than the CAM walker. She relates to edema in the affected ankle.  She does relate to a history of ankle sprain on the contralateral limb. She has been in PT for her right foot numbness to 4th and 5th digits. She has been seeing Dr. Verta, DPM for the right foot numbness.   Review of Systems: Negative except as noted in the HPI. Denies N/V/F/Ch.  Past Medical History:  Diagnosis Date   Cervical cancer (HCC)     Current Outpatient Medications:    Multiple Vitamin (MULTIVITAMIN) capsule, Take 1 capsule by mouth daily., Disp: , Rfl:    VITAMIN D PO, Take by mouth., Disp: , Rfl:   Social History   Tobacco Use  Smoking Status Never  Smokeless Tobacco Never    Allergies  Allergen Reactions   Tramadol     Other Reaction(s): Other (See Comments)  Other reaction(s): Other (See Comments)  Heart racing  Heart racing  Other reaction(s): Other (See Comments)  Heart racing   Codeine Rash   Oxycodone Rash   Objective:  There were no vitals filed for this visit. There is no height or weight on file to calculate BMI. Constitutional Well developed. Well nourished. Oriented to person, place, and time.  Vascular Dorsalis pedis pulses palpable bilaterally. Posterior tibial pulses palpable bilaterally. Capillary refill normal to all digits.  No cyanosis or clubbing noted. Pedal hair growth normal.  Neurologic Normal speech. Epicritic  sensation to light touch grossly present to majority of bilaterally feet. Decreased sensation with numbness/tingling to right 4th and 5th digits. Normal sensation restored at distal metatarsal 4/5 shafts. No mulder click.  Negative tinel sign at tarsal tunnel bilaterally.   Dermatologic Skin texture and turgor are within normal limits.  Open wounds and skin lesions absent. Skin tenting/prominent bone just deep to skin absent. Edema present to the left ankle centered at lateral ligament complex.  No soft tissue mass able to be palpated on the right foot intermediate to the 4th and 5th metatarsals.  Musculoskeletal: 5 out of 5 muscle strength to all major pedal muscle groups.  Minor cavus foot shape.  Hindfoot range of motion pain-free without limitations.  Ankle ROM non-painful and WNL. Anterior drawer negative  Pain to palpation of the lateral ankle ligament complex.  No pain to peroneals.  No pain with squeeze of tibia and fibula.   Radiographs: 3 views of the foot and 2 views of the ankle were previously taken. No acute osseous pathology is identified.  Mild pes cavus foot shape.  No fractures to the lateral malleolus, medial malleolus, posterior malleolus.  No syndesmotic widening.  Joints well-maintained.  Assessment:   1. Foot pain, left   2. Moderate left ankle sprain, initial encounter   3. Mass of soft tissue of foot   4. 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 Interdigital neuroma of right foot  Plan:  - Patient was evaluated and treated and all questions answered.  Left ankle sprain - Discussed the diagnosis of left ankle sprain with the patient.  Continue lace up ankle brace. Stretches for ankle sprain were printed and provided to the patient today.  - She is close to 2 months after injury and has had minimal improvement.  Her swelling has improved but her pain has been persistent.  Stretching and the physical therapy that she has been doing have not been helping her  ankle pain. -I do recommend an MRI of the left ankle to evaluate peroneals, lateral ligament complex of ankle, possible osteochondral lesion.  Depending on these results and clinical course, may discuss surgical intervention. - Weight bearing: As tolerated - Cast/boot/shoe: lace up brace  Right foot neuropathy - Discussed the diagnosis of right foot neuropathy with possible neuroma intermediate to the 4th and 5th interspaces.  Patient does have decreased sensation in this area and relates to numbness and tingling. - Patient has attempted physical therapy specifically for the right foot neuropathy.  She has had minimal improvement.  Recommend MRI of right foot to evaluate for Morton's neuroma, nerve abnormality, or soft tissue mass that is unable to be palpated clinically.  Return to clinic after MRIs     Prentice Ovens, [facility] AACFAS Fellowship Trained Podiatric Surgeon [facility]

## 2023-12-11 ENCOUNTER — Ambulatory Visit

## 2023-12-15 ENCOUNTER — Ambulatory Visit

## 2023-12-22 ENCOUNTER — Telehealth: Payer: Self-pay

## 2023-12-22 NOTE — Telephone Encounter (Signed)
 She mentioned insurance approved through November 8th

## 2023-12-22 NOTE — Telephone Encounter (Signed)
 Patient called stating that MRI was discussed a couple of weeks ago. Insurance has approved it . And she is ready to proceed with MRI

## 2023-12-24 ENCOUNTER — Ambulatory Visit

## 2024-01-06 ENCOUNTER — Ambulatory Visit
Admission: RE | Admit: 2024-01-06 | Discharge: 2024-01-06 | Disposition: A | Discharge status: Home / Self Care | Source: Ambulatory Visit

## 2024-01-06 DIAGNOSIS — M79672 Pain in left foot: Secondary | ICD-10-CM | POA: Insufficient documentation

## 2024-01-06 DIAGNOSIS — G5781 Other specified mononeuropathies of right lower limb: Secondary | ICD-10-CM | POA: Diagnosis present

## 2024-01-06 DIAGNOSIS — S93402A Sprain of unspecified ligament of left ankle, initial encounter: Secondary | ICD-10-CM | POA: Diagnosis present

## 2024-01-06 DIAGNOSIS — M7989 Other specified soft tissue disorders: Secondary | ICD-10-CM | POA: Insufficient documentation

## 2024-01-06 MED ORDER — GADOBUTROL 1 MMOL/ML IV SOLN
5.0000 mL | Freq: Once | INTRAVENOUS | Status: AC | PRN
  Administered 2024-01-06: 5 mL via INTRAVENOUS

## 2024-01-14 ENCOUNTER — Ambulatory Visit

## 2024-01-15 ENCOUNTER — Telehealth: Payer: Self-pay

## 2024-01-15 ENCOUNTER — Ambulatory Visit (INDEPENDENT_AMBULATORY_CARE_PROVIDER_SITE_OTHER)

## 2024-01-15 DIAGNOSIS — G5781 Other specified mononeuropathies of right lower limb: Secondary | ICD-10-CM

## 2024-01-15 DIAGNOSIS — M79672 Pain in left foot: Secondary | ICD-10-CM | POA: Diagnosis not present

## 2024-01-15 DIAGNOSIS — S93402D Sprain of unspecified ligament of left ankle, subsequent encounter: Secondary | ICD-10-CM

## 2024-01-15 NOTE — Telephone Encounter (Signed)
 Patient is in Dr. Dorn BRAVO. Trudy' office and told him she needs to be evaluated by him prior to surgery. Dr. Trudy would like to know what you need from him regarding her upcoming surgery.  Contact information: Phone: [phone] Fax: [phone]

## 2024-01-15 NOTE — H&P (View-Only) (Signed)
 Subjective:  Patient ID: Meredith Whitaker, female    DOB: November 19, 1970,  MRN: [id number]  Chief Complaint  Patient presents with   Foot Pain    MRI results. Right toes are numb 3/10 pain scale and the left ankle is a 5/10 pain scale. She didn't use the brace yesterday so her ankle is hurting this morning.   53 year old female returns to clinic for review of MRI of right foot and left ankle and for possible surgical planning.  She states unchanged symptomatology, minor improvement when wearing the brace on the left ankle.  Interval history 12/10/23: 53 y.o. female presents with complaint of painful left ankle. Patient relates history of ankle inversion trauma that occurred on 11/09/23. Since that time, patient had attempted CAM Walker therapy with weightbearing, she then transitioned into a lace up ankle brace. She states that she is still in pain while walking. The brace feels better than the CAM walker. She relates to edema in the affected ankle.  She does relate to a history of ankle sprain on the contralateral limb. She has been in PT for her right foot numbness to 4th and 5th digits. She has been seeing Dr. Verta, DPM for the right foot numbness.   Review of Systems: Negative except as noted in the HPI. Denies N/V/F/Ch.  Past Medical History:  Diagnosis Date   Cervical cancer (HCC)     Current Outpatient Medications:    Multiple Vitamin (MULTIVITAMIN) capsule, Take 1 capsule by mouth daily., Disp: , Rfl:    VITAMIN D PO, Take by mouth., Disp: , Rfl:   Social History   Tobacco Use  Smoking Status Never  Smokeless Tobacco Never    Allergies  Allergen Reactions   Tramadol     Other Reaction(s): Other (See Comments)  Other reaction(s): Other (See Comments)  Heart racing  Heart racing  Other reaction(s): Other (See Comments)  Heart racing   Codeine Rash   Oxycodone Rash   Objective:  There were no vitals filed for this visit. There is no height or weight on file to calculate  BMI. Constitutional Well developed. Well nourished. Oriented to person, place, and time.  Vascular Dorsalis pedis pulses palpable bilaterally. Posterior tibial pulses palpable bilaterally. Capillary refill normal to all digits.  No cyanosis or clubbing noted. Pedal hair growth normal.  Neurologic Normal speech. Epicritic sensation to light touch grossly present to majority of bilaterally feet. Decreased sensation with numbness/tingling to right 4th and 5th digits. Normal sensation restored at distal metatarsal 4/5 shafts. No mulder click.  Negative tinel sign at tarsal tunnel bilaterally.   Dermatologic Skin texture and turgor are within normal limits.  Open wounds and skin lesions absent. Skin tenting/prominent bone just deep to skin absent. Edema present to the left ankle centered at lateral ligament complex.  No soft tissue mass able to be palpated on the right foot intermediate to the third and 4th or fourth and 5th metatarsals.  Musculoskeletal: 5 out of 5 muscle strength to all major pedal muscle groups.  Minor cavus foot shape.  Hindfoot range of motion pain-free without limitations.  Ankle ROM non-painful and WNL. Anterior drawer positive.  Pain to palpation of the lateral ankle ligament complex.  Mild pain to peroneals.  No pain with squeeze of tibia and fibula. Pain with palpation of 3rd and 4th interspaces right foot.   MRI right foot: Soft tissue mass consistent with possible neuroma identified in the right third interspace adjacent to the metatarsal heads.  No soft  tissue mass consistent with neuroma is identified intermediate to the 4th and 5th despite the patient's continued symptomatology.  MRI left ankle: IMPRESSION: Thickened and abnormal signal in the anterior talofibular ligament. There may be a full-thickness disruption. Correlation for instability. Posterior talofibular ligament is intact. Calcaneofibular ligament is not well seen.   There is mild tenosynovitis and  tendinosis of the distal peroneal tendons without subluxation. Moderate lateral soft tissue swelling is present.  Assessment:   1. 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 Interdigital neuroma of right foot   3. Foot pain, left   4. Moderate left ankle sprain, subsequent encounter      Plan:  - Patient was evaluated and treated and all questions answered.  Right foot neuropathy - Discussed the diagnosis of right foot neuropathy with possible neuroma in the 3rd and 4th interspaces.  The third interspace did show a neuroma on MRI.  The fourth interspace did not show an MRI despite continued clinical symptomatology for over 6 months.  Patient does have decreased sensation in this area and relates to numbness and tingling. - Patient has attempted physical therapy specifically for the right foot neuropathy.  She has had minimal improvement.  She is interested in surgical intervention.  Please see below for surgical plan.  Left ankle sprain - Discussed the diagnosis of left ankle sprain with the patient.  Continue lace up ankle brace. Stretches for ankle sprain were printed and provided to the patient today.  - She is now 2-1/2 to 3 months after initial injury.  MRI does show disruption of the ATFL with poorly visualized calcaneofibular ligament.  She has continued symptomatology despite conservative treatments.  We discussed surgical intervention for this.  She is interested in pursuing surgery. She would like to proceed with the right foot first due to her schedule.  She is going to [place] in December, will be back mid December and then is going on a cruise in late January.  Informed surgical risk consent was reviewed and read aloud to the patient.  I reviewed the imaging.  I have discussed my findings with the patient in great detail.  I have discussed all risks including but not limited to infection, stiffness, scarring, limp, disability, deformity, damage to blood vessels and nerves,  numbness, poor healing, need for braces, arthritis, chronic pain, amputation, and death.  All benefits and realistic expectations discussed in great detail.  I have made no promises as to the outcome.  I have provided realistic expectations.  I have offered the patient a 2nd opinion, which they have declined and assured me they preferred to proceed despite the risks.  Planned procedures: Right foot 3rd and 4th intermetatarsal space neuroma excision  Post-operative weight bearing status: WBAT in CAM walker that the patient already has  VTE risk assessment/need for prophylaxis: No DVT prophylaxis indicated   Post-operative pain management: Patient has allergies to oxycodone, hydrocodone, and tramadol. I will prescribe journavx, if she is unable to get it due to cost we will plan to proceed with tylenol, ibuprofen   H&P: patient will see her PCP within 30 days of surgery  Patient risk factors that were discussed: None identified  Looking at surgery end of December so she has adequate healing time prior to going on her cruise. We did discuss that she will still have post-operative swelling and may not be ready to submerge her foot when on her cruise.    Prentice Ovens, DPM AACFAS Fellowship Trained Podiatric Surgeon  [facility]

## 2024-01-15 NOTE — Progress Notes (Signed)
 Subjective:  Patient ID: Meredith Whitaker, female    DOB: November 19, 1970,  MRN: [id number]  Chief Complaint  Patient presents with   Foot Pain    MRI results. Right toes are numb 3/10 pain scale and the left ankle is a 5/10 pain scale. She didn't use the brace yesterday so her ankle is hurting this morning.   53 year old female returns to clinic for review of MRI of right foot and left ankle and for possible surgical planning.  She states unchanged symptomatology, minor improvement when wearing the brace on the left ankle.  Interval history 12/10/23: 53 y.o. female presents with complaint of painful left ankle. Patient relates history of ankle inversion trauma that occurred on 11/09/23. Since that time, patient had attempted CAM Walker therapy with weightbearing, she then transitioned into a lace up ankle brace. She states that she is still in pain while walking. The brace feels better than the CAM walker. She relates to edema in the affected ankle.  She does relate to a history of ankle sprain on the contralateral limb. She has been in PT for her right foot numbness to 4th and 5th digits. She has been seeing Dr. Verta, DPM for the right foot numbness.   Review of Systems: Negative except as noted in the HPI. Denies N/V/F/Ch.  Past Medical History:  Diagnosis Date   Cervical cancer (HCC)     Current Outpatient Medications:    Multiple Vitamin (MULTIVITAMIN) capsule, Take 1 capsule by mouth daily., Disp: , Rfl:    VITAMIN D PO, Take by mouth., Disp: , Rfl:   Social History   Tobacco Use  Smoking Status Never  Smokeless Tobacco Never    Allergies  Allergen Reactions   Tramadol     Other Reaction(s): Other (See Comments)  Other reaction(s): Other (See Comments)  Heart racing  Heart racing  Other reaction(s): Other (See Comments)  Heart racing   Codeine Rash   Oxycodone Rash   Objective:  There were no vitals filed for this visit. There is no height or weight on file to calculate  BMI. Constitutional Well developed. Well nourished. Oriented to person, place, and time.  Vascular Dorsalis pedis pulses palpable bilaterally. Posterior tibial pulses palpable bilaterally. Capillary refill normal to all digits.  No cyanosis or clubbing noted. Pedal hair growth normal.  Neurologic Normal speech. Epicritic sensation to light touch grossly present to majority of bilaterally feet. Decreased sensation with numbness/tingling to right 4th and 5th digits. Normal sensation restored at distal metatarsal 4/5 shafts. No mulder click.  Negative tinel sign at tarsal tunnel bilaterally.   Dermatologic Skin texture and turgor are within normal limits.  Open wounds and skin lesions absent. Skin tenting/prominent bone just deep to skin absent. Edema present to the left ankle centered at lateral ligament complex.  No soft tissue mass able to be palpated on the right foot intermediate to the third and 4th or fourth and 5th metatarsals.  Musculoskeletal: 5 out of 5 muscle strength to all major pedal muscle groups.  Minor cavus foot shape.  Hindfoot range of motion pain-free without limitations.  Ankle ROM non-painful and WNL. Anterior drawer positive.  Pain to palpation of the lateral ankle ligament complex.  Mild pain to peroneals.  No pain with squeeze of tibia and fibula. Pain with palpation of 3rd and 4th interspaces right foot.   MRI right foot: Soft tissue mass consistent with possible neuroma identified in the right third interspace adjacent to the metatarsal heads.  No soft  tissue mass consistent with neuroma is identified intermediate to the 4th and 5th despite the patient's continued symptomatology.  MRI left ankle: IMPRESSION: Thickened and abnormal signal in the anterior talofibular ligament. There may be a full-thickness disruption. Correlation for instability. Posterior talofibular ligament is intact. Calcaneofibular ligament is not well seen.   There is mild tenosynovitis and  tendinosis of the distal peroneal tendons without subluxation. Moderate lateral soft tissue swelling is present.  Assessment:   1. 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 Interdigital neuroma of right foot   3. Foot pain, left   4. Moderate left ankle sprain, subsequent encounter      Plan:  - Patient was evaluated and treated and all questions answered.  Right foot neuropathy - Discussed the diagnosis of right foot neuropathy with possible neuroma in the 3rd and 4th interspaces.  The third interspace did show a neuroma on MRI.  The fourth interspace did not show an MRI despite continued clinical symptomatology for over 6 months.  Patient does have decreased sensation in this area and relates to numbness and tingling. - Patient has attempted physical therapy specifically for the right foot neuropathy.  She has had minimal improvement.  She is interested in surgical intervention.  Please see below for surgical plan.  Left ankle sprain - Discussed the diagnosis of left ankle sprain with the patient.  Continue lace up ankle brace. Stretches for ankle sprain were printed and provided to the patient today.  - She is now 2-1/2 to 3 months after initial injury.  MRI does show disruption of the ATFL with poorly visualized calcaneofibular ligament.  She has continued symptomatology despite conservative treatments.  We discussed surgical intervention for this.  She is interested in pursuing surgery. She would like to proceed with the right foot first due to her schedule.  She is going to [place] in December, will be back mid December and then is going on a cruise in late January.  Informed surgical risk consent was reviewed and read aloud to the patient.  I reviewed the imaging.  I have discussed my findings with the patient in great detail.  I have discussed all risks including but not limited to infection, stiffness, scarring, limp, disability, deformity, damage to blood vessels and nerves,  numbness, poor healing, need for braces, arthritis, chronic pain, amputation, and death.  All benefits and realistic expectations discussed in great detail.  I have made no promises as to the outcome.  I have provided realistic expectations.  I have offered the patient a 2nd opinion, which they have declined and assured me they preferred to proceed despite the risks.  Planned procedures: Right foot 3rd and 4th intermetatarsal space neuroma excision  Post-operative weight bearing status: WBAT in CAM walker that the patient already has  VTE risk assessment/need for prophylaxis: No DVT prophylaxis indicated   Post-operative pain management: Patient has allergies to oxycodone, hydrocodone, and tramadol. I will prescribe journavx, if she is unable to get it due to cost we will plan to proceed with tylenol, ibuprofen   H&P: patient will see her PCP within 30 days of surgery  Patient risk factors that were discussed: None identified  Looking at surgery end of December so she has adequate healing time prior to going on her cruise. We did discuss that she will still have post-operative swelling and may not be ready to submerge her foot when on her cruise.    Prentice Ovens, DPM AACFAS Fellowship Trained Podiatric Surgeon  [facility]

## 2024-01-15 NOTE — Telephone Encounter (Signed)
 Gave following message to Eddie from Dr. RONAL Ovens to give to PCP:   She was supposed to see him within 30 days of surgery for H&P. I just saw her this morning and filled out surgical paperwork so not sure when she will officially be scheduled. If he wants to an H&P today since she's already there I can do an updated H&P on surgery day   Eddie also stated the patient had already left the office.

## 2024-01-21 ENCOUNTER — Ambulatory Visit

## 2024-01-27 ENCOUNTER — Telehealth: Payer: Self-pay

## 2024-01-27 NOTE — Telephone Encounter (Signed)
 Called and patient scheduled for 02/02/2024. Patient not on any GLP1 medications or blood thinners. Patient pharmacy is correct in chart.

## 2024-01-28 ENCOUNTER — Telehealth: Payer: Self-pay

## 2024-01-28 ENCOUNTER — Other Ambulatory Visit: Payer: Self-pay

## 2024-01-28 ENCOUNTER — Encounter
Admission: RE | Admit: 2024-01-28 | Discharge: 2024-01-28 | Disposition: A | Discharge status: Home / Self Care | Source: Ambulatory Visit

## 2024-01-28 HISTORY — DX: Sprain of other ligament of left ankle, initial encounter: S93.492A

## 2024-01-28 HISTORY — DX: Lesion of plantar nerve, right lower limb: G57.61

## 2024-01-28 NOTE — Telephone Encounter (Signed)
 DOS- 02/02/2024  EXCISION, INTERDIGITAL NEUROMA, SINGLE, EACH RT 3RD + 4TH INTERSPACES RT- [id number]  [employer] NEXT EFFECTIVE DATE- 04/05/2023  PER [employer], PRIOR AUTH FOR CPT CODE 71919 (2 UNITS) HAS BEEN APPROVED FROM 02/02/2024-05/02/2024. AUTH# [id number]

## 2024-01-28 NOTE — Patient Instructions (Addendum)
 Your procedure is scheduled on: MONDAY 02/02/24 Report to the Registration Desk on the 1st floor of the [facility].  To find out your arrival time, please call [phone] between 1PM - 3PM on: FRIDAY 01/30/24 If your arrival time is 6:00 am, do not arrive before that time as the [facility] entrance doors do not open until 6:00 am.  REMEMBER: Instructions that are not followed completely may result in serious medical risk, up to and including death; or upon the discretion of your surgeon and anesthesiologist your surgery may need to be rescheduled.  Do not eat food after midnight the night before surgery.  No gum chewing or hard candies.  You may however, drink CLEAR liquids up to 2 hours before you are scheduled to arrive for your surgery. Do not drink anything within 2 hours of your scheduled arrival time.  Clear liquids include: - water  - apple juice without pulp - gatorade (not RED colors) - black coffee or tea (Do NOT add milk or creamers to the coffee or tea) Do NOT drink anything that is not on this list.  One week prior to surgery: Stop Anti-inflammatories (NSAIDS) such as Advil, Aleve , Ibuprofen, Motrin, Naproxen , Naprosyn  and Aspirin based products such as Excedrin, Goody's Powder, BC Powder.  Stop ANY OVER THE COUNTER supplements until after surgery.  You may however, continue to take Tylenol  if needed for pain up until the day of surgery.  Continue taking all of your other prescription medications up until the day of surgery.  ON THE DAY OF SURGERY ONLY TAKE THESE MEDICATIONS WITH SIPS OF WATER:  NONE  Use inhalers on the day of surgery and bring to the hospital.  No Alcohol for 24 hours before or after surgery.  No Smoking including e-cigarettes for 24 hours before surgery.  No chewable tobacco products for at least 6 hours before surgery.  No nicotine patches on the day of surgery.  Do not use any recreational drugs for at least a week (preferably 2  weeks) before your surgery.  Please be advised that the combination of cocaine and anesthesia may have negative outcomes, up to and including death. If you test positive for cocaine, your surgery will be cancelled.  On the morning of surgery brush your teeth with toothpaste and water, you may rinse your mouth with mouthwash if you wish. Do not swallow any toothpaste or mouthwash.  Use CHG Soap or wipes as directed on instruction sheet.  Do not wear jewelry, make-up, hairpins, clips or nail polish.  For welded (permanent) jewelry: bracelets, anklets, waist bands, etc.  Please have this removed prior to surgery.  If it is not removed, there is a chance that hospital personnel will need to cut it off on the day of surgery.  Do not wear lotions, powders, or perfumes.   Do not shave body hair from the neck down 48 hours before surgery.  Contact lenses, hearing aids and dentures may not be worn into surgery.  Do not bring valuables to the hospital. [facility] is not responsible for any missing/lost belongings or valuables.   Bring your C-PAP to the hospital in case you may have to spend the night.   Notify your doctor if there is any change in your medical condition (cold, fever, infection).  Wear comfortable clothing (specific to your surgery type) to the hospital.  After surgery, you can help prevent lung complications by doing breathing exercises.  Take deep breaths and cough every 1-2 hours. Your  doctor may order a device called an Incentive Spirometer to help you take deep breaths..  If you are being discharged the day of surgery, you will not be allowed to drive home. You will need a responsible individual to drive you home and stay with you for 24 hours after surgery.   If you are taking public transportation, you will need to have a responsible individual with you.  Please call the Pre-admissions Testing Dept. at [phone] if you have any questions about these  instructions.  Surgery Visitation Policy:  Patients having surgery or a procedure may have two visitors.  Children under the age of 76 must have an adult with them who is not the patient.  Merchandiser, Retail to address health-related social needs:  [email or web address].proor.no                                                                                                             Preparing for Surgery with CHLORHEXIDINE GLUCONATE (CHG) Soap  Chlorhexidine Gluconate (CHG) Soap  o An antiseptic cleaner that kills germs and bonds with the skin to continue killing germs even after washing  o Used for showering the night before surgery and morning of surgery  Before surgery, you can play an important role by reducing the number of germs on your skin.  CHG (Chlorhexidine gluconate) soap is an antiseptic cleanser which kills germs and bonds with the skin to continue killing germs even after washing.  Please do not use if you have an allergy to CHG or antibacterial soaps. If your skin becomes reddened/irritated stop using the CHG.  1. Shower the NIGHT BEFORE SURGERY with CHG soap.  2. If you choose to wash your hair, wash your hair first as usual with your normal shampoo.  3. After shampooing, rinse your hair and body thoroughly to remove the shampoo.  4. Use CHG as you would any other liquid soap. You can apply CHG directly to the skin and wash gently with a clean washcloth.  5. Apply the CHG soap to your body only from the neck down. Do not use on open wounds or open sores. Avoid contact with your eyes, ears, mouth, and genitals (private parts). Wash face and genitals (private parts) with your normal soap.  6. Wash thoroughly, paying special attention to the area where your surgery will be performed.  7. Thoroughly rinse your body with warm water.  8. Do not shower/wash with your normal soap after using and rinsing off the CHG soap.  9. Do not use lotions, oils,  etc., after showering with CHG.   10. Pat yourself dry with a clean towel.  11. Wear clean pajamas to bed the night before surgery.  12. Place clean sheets on your bed the night of your shower and do not sleep with pets.  13. Do not apply any deodorants/lotions/powders.  14. Please wear clean clothes to the hospital.  15. Remember to brush your teeth with your regular toothpaste.

## 2024-02-02 ENCOUNTER — Other Ambulatory Visit: Payer: Self-pay

## 2024-02-02 ENCOUNTER — Ambulatory Visit: Payer: Self-pay | Admitting: Anesthesiology

## 2024-02-02 ENCOUNTER — Encounter: Admission: RE | Disposition: A | Discharge status: Home / Self Care | Payer: Self-pay | Source: Home / Self Care

## 2024-02-02 ENCOUNTER — Encounter: Payer: Self-pay | Admitting: Anesthesiology

## 2024-02-02 ENCOUNTER — Ambulatory Visit: Admission: RE | Admit: 2024-02-02 | Discharge: 2024-02-02 | Disposition: A | Discharge status: Home / Self Care

## 2024-02-02 DIAGNOSIS — G5761 Lesion of plantar nerve, right lower limb: Secondary | ICD-10-CM | POA: Diagnosis not present

## 2024-02-02 DIAGNOSIS — G5781 Other specified mononeuropathies of right lower limb: Secondary | ICD-10-CM | POA: Diagnosis not present

## 2024-02-02 DIAGNOSIS — G5760 Lesion of plantar nerve, unspecified lower limb: Secondary | ICD-10-CM | POA: Diagnosis present

## 2024-02-02 HISTORY — PX: EXCISION MORTON'S NEUROMA: SHX5013

## 2024-02-02 MED ORDER — PROPOFOL 500 MG/50ML IV EMUL
INTRAVENOUS | Status: DC | PRN
  Administered 2024-02-02: 125 ug/kg/min via INTRAVENOUS

## 2024-02-02 MED ORDER — 0.9 % SODIUM CHLORIDE (POUR BTL) OPTIME
TOPICAL | Status: DC | PRN
  Administered 2024-02-02: 120 mL

## 2024-02-02 MED ORDER — LIDOCAINE-EPINEPHRINE 1 %-1:100000 IJ SOLN
INTRAMUSCULAR | Status: AC
  Filled 2024-02-02: qty 1

## 2024-02-02 MED ORDER — ORAL CARE MOUTH RINSE: 15.0000 mL | Freq: Once | OROMUCOSAL | Status: AC

## 2024-02-02 MED ORDER — ACETAMINOPHEN 10 MG/ML IV SOLN: 1000.0000 mg | Freq: Once | INTRAVENOUS | Status: DC | PRN

## 2024-02-02 MED ORDER — CEFAZOLIN SODIUM-DEXTROSE 2-4 GM/100ML-% IV SOLN
2.0000 g | INTRAVENOUS | Status: AC
  Administered 2024-02-02: 2 g via INTRAVENOUS

## 2024-02-02 MED ORDER — CHLORHEXIDINE GLUCONATE 0.12 % MT SOLN
15.0000 mL | Freq: Once | OROMUCOSAL | Status: AC
  Administered 2024-02-02: 15 mL via OROMUCOSAL

## 2024-02-02 MED ORDER — LIDOCAINE HCL (PF) 2 % IJ SOLN
INTRAMUSCULAR | Status: AC
  Filled 2024-02-02: qty 5

## 2024-02-02 MED ORDER — DEXAMETHASONE SOD PHOSPHATE PF 10 MG/ML IJ SOLN
INTRAMUSCULAR | Status: DC | PRN
  Administered 2024-02-02: 10 mg

## 2024-02-02 MED ORDER — JOURNAVX 50 MG PO TABS: 50.0000 mg | ORAL_TABLET | Freq: Two times a day (BID) | ORAL | 0 refills | Status: AC

## 2024-02-02 MED ORDER — MIDAZOLAM HCL 2 MG/2ML IJ SOLN
INTRAMUSCULAR | Status: AC
  Filled 2024-02-02: qty 2

## 2024-02-02 MED ORDER — LACTATED RINGERS IV SOLN: INTRAVENOUS | Status: DC

## 2024-02-02 MED ORDER — OXYCODONE HCL 5 MG/5ML PO SOLN: 5.0000 mg | Freq: Once | ORAL | Status: DC | PRN

## 2024-02-02 MED ORDER — FENTANYL CITRATE (PF) 100 MCG/2ML IJ SOLN: 25.0000 ug | INTRAMUSCULAR | Status: DC | PRN

## 2024-02-02 MED ORDER — CHLORHEXIDINE GLUCONATE 0.12 % MT SOLN
OROMUCOSAL | Status: AC
  Filled 2024-02-02: qty 15

## 2024-02-02 MED ORDER — MIDAZOLAM HCL (PF) 2 MG/2ML IJ SOLN
INTRAMUSCULAR | Status: DC | PRN
  Administered 2024-02-02: 2 mg via INTRAVENOUS

## 2024-02-02 MED ORDER — BUPIVACAINE HCL (PF) 0.5 % IJ SOLN
INTRAMUSCULAR | Status: DC | PRN
  Administered 2024-02-02: 11 mL
  Administered 2024-02-02: 19 mL

## 2024-02-02 MED ORDER — OXYCODONE HCL 5 MG PO TABS: 5.0000 mg | ORAL_TABLET | Freq: Once | ORAL | Status: DC | PRN

## 2024-02-02 MED ORDER — BUPIVACAINE HCL (PF) 0.5 % IJ SOLN
INTRAMUSCULAR | Status: AC
  Filled 2024-02-02: qty 30

## 2024-02-02 MED ORDER — PROPOFOL 1000 MG/100ML IV EMUL
INTRAVENOUS | Status: AC
  Filled 2024-02-02: qty 100

## 2024-02-02 MED ORDER — DROPERIDOL 2.5 MG/ML IJ SOLN: 0.6250 mg | Freq: Once | INTRAMUSCULAR | Status: DC | PRN

## 2024-02-02 MED ORDER — FENTANYL CITRATE (PF) 100 MCG/2ML IJ SOLN
INTRAMUSCULAR | Status: AC
  Filled 2024-02-02: qty 2

## 2024-02-02 MED ORDER — CEFAZOLIN SODIUM-DEXTROSE 2-4 GM/100ML-% IV SOLN
INTRAVENOUS | Status: AC
  Filled 2024-02-02: qty 100

## 2024-02-02 MED ORDER — FENTANYL CITRATE (PF) 100 MCG/2ML IJ SOLN
INTRAMUSCULAR | Status: DC | PRN
  Administered 2024-02-02: 50 ug via INTRAVENOUS

## 2024-02-02 SURGICAL SUPPLY — 2 items
NDL FILTER BLUNT 18X1 1/2 (NEEDLE) ×1 IMPLANT
NDL HYPO 25X1 1.5 SAFETY (NEEDLE) ×2 IMPLANT

## 2024-02-02 NOTE — Op Note (Addendum)
 Patient Name: Meredith Whitaker DOB: 09-24-1970  MRN: [id number]   Date of Service: 02/02/2024  Surgeon: Dr. Prentice Ovens, DPM Assistants: None Pre-operative Diagnosis:  Neuroma, third and fourth interspace right foot Post-operative Diagnosis:  Neuroma, third and fourth interspace right foot Procedures: Excision, third interspace neuroma Excision, fourth interspace neuroma Pathology/Specimens: * No specimens in log * Anesthesia:  local and MAC. 19cc 0.5%marcaine plain:1cc dexamethasone phosphate 10mg /mL pre-operatively, 10cc 0.5%marcaine plain post-operatively  Hemostasis:  Total Tourniquet Time Documented: Calf (Right) - 28 minutes Total: Calf (Right) - 28 minutes  Estimated Blood Loss: 0 mL Materials: * No implants in log * Medications: 19cc 0.5%marcaine plain:1cc dexamethasone phosphate 10mg /mL pre-operatively, 10cc 0.5%marcaine plain post-operatively  Complications: None  Indications for Procedure:  This is a 53 y.o. female with a history of pain to both her 3rd and 4th intermetatarsal spaces on the right foot.  She was diagnosed with neuromas to both feet.  MRI imaging confirmed neuroma to the third intermetatarsal space.  The fourth intermetatarsal space continued to be clinically symptomatic for the patient.  We discussed treatment options ranging from conservative care to surgical.  Patient wished to proceed with surgical intervention with removal of neuroma in the 3rd and 4th interspaces.  All risks, benefits, possible complications were discussed.  No guarantees were given.  Patient understands this and wished to proceed with surgery.   Procedure in Detail: Patient was identified in pre-operative holding area. Formal consent was signed and the right lower extremity was marked. Patient was brought back to the operating room.  Ancef 2 g was given for surgical prophylaxis.  Anesthesia was induced.  Calf tourniquet was applied.  Preoperative block as described above was given proximal  to the surgical site.  The extremity was prepped and draped in the usual sterile fashion. Timeout was taken to confirm patient name, laterality, and procedure prior to incision.   Procedure 1. 3rd interspace Neuroma Excision: CPT 28080 The foot was exsanguinated and tourniquet inflated to 250 mmHg.  Attention was directed to the third interspace on the right foot. Using a #15 blade, a linear incision was made through skin from the webspace and proximally about 3 cm in length. The incision was deepened with sharp and blunt dissection and care was taken to protect all vital structures. All bleeders were cauterized as necessary. Identification of the neuroma was made following plantar flexion of the digits. The nerve was noted to be hypertophied and irregular, consistent with a neuroma. It was grasped with a hemostat and it was dissected in total and removed. It was then sent to histopathology. Branches to the toes were then isolated and severed at their course along the digits.  The neuroma was noted to be enlarged and hypertrophic.  The area was then flushed with copious amounts of sterile saline. The intrinsic muscles were evaluated and shown to be completely intact.   Procedure 2. 4th interspace Neuroma excision: CPT 28080 Attention was directed to the fourth interspace on the right foot. Using a #15 blade, a linear incision was made through skin from the webspace and proximally about 3 cm in length. The incision was deepened with sharp and blunt dissection and care was taken to protect all vital structures. All bleeders were cauterized as necessary. Identification of the neuroma was made following plantar flexion of the digits. The nerve was noted to be hypertophied and irregular, consistent with a neuroma. It was grasped with a hemostat and it was dissected in total and removed. It was then  sent to histopathology. Branches to the toes were then isolated and severed at their course along the digits.  The  nerve was less hypertrophic and appeared healthier than the nerve in the third interspace.  The area was then flushed with copious amounts of sterile saline. The intrinsic muscles were evaluated and shown to be completely intact.    Both incisions were then closed utilizing 3-0 Monocryl for subcutaneous closure and 4-0 nylon in horizontal mattress fashion for skin closure.  The tourniquet was released with immediate hyperemic response noted and pinking of all digits with normal CFT. Postoperative block as described above was then given proximal to the surgical sites.  The foot was then dressed with Xeroform, 4 x 4, Kerlix, Ace. Patient tolerated the procedure well.   Disposition: Following a period of post-operative monitoring, patient will be discharged home.  She tolerated the procedure very well.  I discussed with the patient and her friend who accompanied her to surgery that she should be minimally weightbearing while using CAM Walker and rest, elevate, ice the area 20 minutes/h.  I have called in a prescription for Journavx pain medication which she is to use when she feels pain beginning. If she is unable to get Journavx she would like to use tylenol and advil for pain relief as she does not tolerate opioids well. I will see her in clinic in approximately 1 week for post-operative check. She is to contact our office if she has any issues.

## 2024-02-02 NOTE — Interval H&P Note (Signed)
 History and Physical Interval Note:  02/02/2024 7:08 AM  Meredith Whitaker  has presented today for surgery, with the diagnosis of Neuroma 3rd and 4th interspaces, right.  The various methods of treatment have been discussed with the patient and family. After consideration of risks, benefits and other options for treatment, the patient has consented to  Procedure(s) with comments: EXCISION, MORTON'S NEUROMA (Right) - 3RD AND 4TH RIGHT INTERSPACES as a surgical intervention.  The patient's history has been reviewed, patient examined, no change in status, stable for surgery.  I have reviewed the patient's chart and labs.  Questions were answered to the patient's satisfaction.     Prentice PARAS  Kedar Sedano

## 2024-02-02 NOTE — Brief Op Note (Signed)
 02/02/2024  8:34 AM  PATIENT:  Meredith Whitaker  53 y.o. female  PRE-OPERATIVE DIAGNOSIS:  Plantar nerve lesion, unspecified laterality  POST-OPERATIVE DIAGNOSIS:  Plantar nerve lesion, right foot  PROCEDURE:  Procedure(s) with comments: EXCISION, MORTON'S NEUROMA (Right) - 3RD AND 4TH RIGHT INTERSPACES  SURGEON:  Surgeons and Role:    * Neira Bentsen, Prentice PARAS, DPM - Primary  PHYSICIAN ASSISTANT:   ASSISTANTS: none   ANESTHESIA:   local and MAC. 19cc 0.5%marcaine plain:1cc dexamethasone phosphate 10mg /mL pre-operatively, 10cc 0.5%marcaine plain post-operatively  EBL:  10cc   BLOOD ADMINISTERED:none  DRAINS: none   LOCAL MEDICATIONS USED:  MARCAINE     SPECIMEN:  Source of Specimen:  3rd and 4th interspaces soft tissue, right foot  DISPOSITION OF SPECIMEN:  PATHOLOGY  COUNTS:  YES  TOURNIQUET:   Total Tourniquet Time Documented: Calf (Right) - 28 minutes Total: Calf (Right) - 28 minutes   DICTATION: .Note written in EPIC  PLAN OF CARE: Discharge to home after PACU  PATIENT DISPOSITION:  PACU - hemodynamically stable.   Delay start of Pharmacological VTE agent (>24hrs) due to surgical blood loss or risk of bleeding: not applicable

## 2024-02-02 NOTE — Anesthesia Preprocedure Evaluation (Signed)
 Anesthesia Evaluation  Patient identified by MRN, date of birth, ID band Patient awake    Reviewed: Allergy & Precautions, H&P , NPO status , Patient's Chart, lab work & pertinent test results, reviewed documented beta blocker date and time   Airway Mallampati: II  TM Distance: >3 FB Neck ROM: full    Dental  (+) Teeth Intact   Pulmonary neg pulmonary ROS   Pulmonary exam normal        Cardiovascular Exercise Tolerance: Good negative cardio ROS Normal cardiovascular exam Rate:Normal     Neuro/Psych  Neuromuscular disease  negative psych ROS   GI/Hepatic negative GI ROS, Neg liver ROS,,,  Endo/Other  negative endocrine ROS    Renal/GU negative Renal ROS  negative genitourinary   Musculoskeletal   Abdominal   Peds  Hematology negative hematology ROS (+)   Anesthesia Other Findings   Reproductive/Obstetrics negative OB ROS                              Anesthesia Physical Anesthesia Plan  ASA: 2  Anesthesia Plan: General LMA   Post-op Pain Management:    Induction:   PONV Risk Score and Plan:   Airway Management Planned:   Additional Equipment:   Intra-op Plan:   Post-operative Plan:   Informed Consent: I have reviewed the patients History and Physical, chart, labs and discussed the procedure including the risks, benefits and alternatives for the proposed anesthesia with the patient or authorized representative who has indicated his/her understanding and acceptance.       Plan Discussed with: CRNA  Anesthesia Plan Comments:         Anesthesia Quick Evaluation

## 2024-02-02 NOTE — Transfer of Care (Signed)
 Immediate Anesthesia Transfer of Care Note  Patient: Meredith Whitaker  Procedure(s) Performed: EXCISION, MORTON'S NEUROMA (Right: Foot)  Patient Location: PACU  Anesthesia Type:MAC  Level of Consciousness: awake and alert   Airway & Oxygen Therapy: Patient Spontanous Breathing  Post-op Assessment: Report given to RN and Post -op Vital signs reviewed and stable  Post vital signs: stable  Last Vitals:  Vitals Value Taken Time  BP 141/96 02/02/24 08:37  Temp    Pulse 74 02/02/24 08:40  Resp 11 02/02/24 08:40  SpO2 100 % 02/02/24 08:40  Vitals shown include unfiled device data.  Last Pain:  Vitals:   02/02/24 0620  TempSrc: Temporal  PainSc: 2          Complications: No notable events documented.

## 2024-02-02 NOTE — Progress Notes (Signed)
 Patient called from home after having surgery this morning. Was getting into bed and took surgical boot off to find surgical dressing saturated in blood. Did not have any dressing supplies at home and called us  to find out what to do. Stated she attempted to call office number and it just rang. RN called MD on call and asked for further instructions. MD stated for her to get some dressing and wrap around existing dressing for tonight and call office in the morning for evaluation. RN relayed this information to patient and she verbalized understanding.

## 2024-02-02 NOTE — Discharge Instructions (Signed)
 Ice pack behind the knee and along the incision for 20 minutes at a time  No pillows behind the knee  Keep extremity elevated on 2 pillows at all times  Weight bearing as tolerated with post op boot  Do not remove surgical dressing, reinforce surgical dressing as needed with Kerlix

## 2024-02-03 ENCOUNTER — Ambulatory Visit (INDEPENDENT_AMBULATORY_CARE_PROVIDER_SITE_OTHER): Admitting: Podiatry

## 2024-02-03 ENCOUNTER — Telehealth: Payer: Self-pay

## 2024-02-03 DIAGNOSIS — G5781 Other specified mononeuropathies of right lower limb: Secondary | ICD-10-CM

## 2024-02-03 LAB — SURGICAL PATHOLOGY

## 2024-02-03 NOTE — Telephone Encounter (Signed)
 Patient called nursing after hours line to state that her surgical dressing from right foot surgery 02/02/24 with me had been saturated. She was instructed to acquire more dressing supplies and reinforce her dressing. She is going to call our office this morning to get seen today. She can be booked to see me this afternoon or with provider in [place] for dressing change.  Thank you, Prentice Ovens, DPM

## 2024-02-03 NOTE — Progress Notes (Signed)
   Chief Complaint  Patient presents with   Foot Pain    DOS 02/02/2024 RT 3RD AND 4TH EXCISION INTERDIGITAL NEUROMA, SINGLE ([place] pt) (bleeding through bandage). She states no pain. Swelling is present. She just wants to make sure everything is ok    Subjective:  Patient presents today status post excision of 3rd and 4th interdigital neuromas right foot.  DOS: 02/02/2024 with Dr. Prentice Ovens.  She noticed bleeding through the dressing last night presents today for dressing change and evaluation  Past Medical History:  Diagnosis Date   Cervical cancer (HCC)    Morton neuroma of right foot    Sprain of anterior talofibular ligament of left ankle     Past Surgical History:  Procedure Laterality Date   ABDOMINAL HYSTERECTOMY     ROTATOR CUFF REPAIR Right     Allergies  Allergen Reactions   Tramadol      Heart racing   Codeine Rash   Oxycodone Rash   Tape Rash    Only use paper tape    Objective/Physical Exam Neurovascular status intact.  Incision well coapted with sutures intact. No sign of infectious process noted. No dehiscence. No active bleeding noted.  No appreciable edema noted to the surgical extremity.  Assessment: 1. s/p excision of interdigital Morton's neuroma 3rd and 4th right foot. DOS: 02/02/2024.  Dr. Prentice Ovens   Plan of Care:  -Patient was evaluated.  -Dressings changed.  Leave clean dry and intact -Continue WBAT cam boot -Has a scheduled follow-up appointment with surgeon on 02/12/2024  Thresa EMERSON Sar, DPM [facility]  Dr. Thresa EMERSON Sar, DPM    [address] [place], [place] [address]                Office [phone]  Fax [phone]

## 2024-02-09 ENCOUNTER — Telehealth: Payer: Self-pay

## 2024-02-09 NOTE — Telephone Encounter (Signed)
 Pt is having a lot of itching between the big toe and second toe and bottom of  her foot pt states that if feel like water blisters are on the heel part of her  foot that had  that she had the procedure done (R foot) pt wants to know if anything can be done to relieve the itching. Best contact # [phone]

## 2024-02-12 ENCOUNTER — Ambulatory Visit

## 2024-02-12 DIAGNOSIS — Z9889 Other specified postprocedural states: Secondary | ICD-10-CM

## 2024-02-12 DIAGNOSIS — G5781 Other specified mononeuropathies of right lower limb: Secondary | ICD-10-CM

## 2024-02-12 DIAGNOSIS — M7989 Other specified soft tissue disorders: Secondary | ICD-10-CM

## 2024-02-12 NOTE — Progress Notes (Signed)
°  Subjective:  Patient ID: Meredith Whitaker, female    DOB: [birth year],  MRN: [id number]  Chief Complaint  Patient presents with   Routine Post Op    POV #1 DOS 02/02/2024 RT 3R. She is doing well outside of some itching    DOS: 02/02/24 Procedure: Right foot 3rd and 4th interspace neurectomies  53 y.o. female returns for post-op check. She is doing well in her recovery. She does relate some feelings of itchiness to her extremity. She had sanguinous strikethrough POD 1 and came into clinic for a dressing change. She denies any other issues or complications.   Review of Systems: Negative except as noted in the HPI. Denies N/V/F/Ch.  Past Medical History:  Diagnosis Date   Cervical cancer (HCC)    Morton neuroma of right foot    Sprain of anterior talofibular ligament of left ankle    Current Medications[1]  Tobacco Use History[2]  Allergies[3] Objective:  There were no vitals filed for this visit. There is no height or weight on file to calculate BMI. Constitutional Well developed. Well nourished.  Vascular Foot warm and well perfused. Capillary refill normal to all digits.  No calf pain, tenderness, erythema, or tightness.   Neurologic Normal speech. Oriented to person, place, and time. Epicritic sensation to light touch grossly present bilaterally. Numbness to lateral 3rd toe, 4th toe, and medial 5th toe.   Dermatologic Skin healing well without signs of infection. Sutures intact. Skin edges well coapted without signs of infection. Mild edema around surgical site.   Orthopedic: Mild tenderness to palpation noted about the surgical site within normal limits.   Radiographs: Not needed due to soft tissue only surgery Assessment:   1. 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 Interdigital neuroma of right foot   3. Mass of soft tissue of foot   4. Status post right foot surgery    Plan:  Patient was evaluated and treated and all questions answered. Doing well in post  operative recovery.   S/p Right foot surgery  -Progressing as expected post-operatively. Doing well in recovery.  -XR: Not needed.  -WB Status: Weight bearing as tolerated in CAM walker. -Sutures: intact, will be removed at next post-op visit. -Medications: None needed today -Foot redressed. -XR at next visit: not needed - RTC 1 week for suture removal  Left ankle sprain - Continuing to wear trilok brace to provide stability. No acute changes in symptoms  No follow-ups on file.      [1]  Current Outpatient Medications:    hydrOXYzine (ATARAX) 25 MG tablet, Take 25 mg by mouth at bedtime as needed (sleep)., Disp: , Rfl:    loratadine (CLARITIN) 10 MG tablet, Take 10 mg by mouth daily as needed for allergies., Disp: , Rfl:    Multiple Vitamin (MULTIVITAMIN) capsule, Take 1 capsule by mouth daily., Disp: , Rfl:  [2]  Social History Tobacco Use  Smoking Status Never  Smokeless Tobacco Never  [3]  Allergies Allergen Reactions   Tramadol      Heart racing   Codeine Rash   Oxycodone  Rash   Tape Rash    Only use paper tape

## 2024-02-17 ENCOUNTER — Ambulatory Visit

## 2024-02-17 DIAGNOSIS — G5781 Other specified mononeuropathies of right lower limb: Secondary | ICD-10-CM | POA: Diagnosis not present

## 2024-02-17 DIAGNOSIS — Z9889 Other specified postprocedural states: Secondary | ICD-10-CM

## 2024-02-17 NOTE — Progress Notes (Signed)
°  Subjective:  Patient ID: Meredith Whitaker, female    DOB: June 11, 1970,  MRN: [id number]  Chief Complaint  Patient presents with   Routine Post Op    Rm11  POV #2 DOS 02/02/2024 RT 3RD AND 4TH EXCISION  INTERDIGITAL NEUROMA, SINGLE (A. Asja Frommer) Pt says she is doing well.    DOS: 02/02/24 Procedure: Right foot 3rd and 4th interspace neurectomies  53 y.o. female returns for post-op check. She is doing well in her recovery. She does relate some feelings of itchiness to her extremity. She denies any other issues or complications. She is interested in her left ankle surgery that was previously discussed.   Review of Systems: Negative except as noted in the HPI. Denies N/V/F/Ch.  Past Medical History:  Diagnosis Date   Cervical cancer (HCC)    Morton neuroma of right foot    Sprain of anterior talofibular ligament of left ankle    Current Medications[1]  Tobacco Use History[2]  Allergies[3] Objective:  There were no vitals filed for this visit. There is no height or weight on file to calculate BMI. Constitutional Well developed. Well nourished.  Vascular Foot warm and well perfused. Capillary refill normal to all digits.  No calf pain, tenderness, erythema, or tightness.   Neurologic Normal speech. Oriented to person, place, and time. Epicritic sensation to light touch grossly present bilaterally. Numbness to lateral 3rd toe, 4th toe, and medial 5th toe.   Dermatologic Skin well healed without signs of infection. Sutures intact. Skin edges well coapted without signs of infection. Mild edema around surgical site.   Orthopedic: Mild tenderness to palpation noted about the surgical site within normal limits.   Radiographs: Not needed due to soft tissue only surgery Assessment:   1. 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 Interdigital neuroma of right foot   3. Status post right foot surgery     Plan:  Patient was evaluated and treated and all questions answered. Doing well in  post operative recovery.   S/p Right foot surgery  -Progressing as expected post-operatively. Doing well in recovery.  -XR: Not needed.  -Pathology report from surgery still pending -WB Status: Weight bearing as tolerated in surgical shoe. We dispensed her a shoe today.  -Sutures: removed today without complication or dehiscence. Well healed skin.  -Medications: None needed today -Foot redressed. -XR at next visit: not needed   Left ankle sprain - Continuing to wear trilok brace to provide stability. No acute changes in symptoms - Interested in getting surgery scheduled  RTC 3-4 weeks for repeat evaluation right foot and for evaluation of left ankle with possible surgery scheduling  No follow-ups on file.      [1]  Current Outpatient Medications:    hydrOXYzine (ATARAX) 25 MG tablet, Take 25 mg by mouth at bedtime as needed (sleep)., Disp: , Rfl:    loratadine (CLARITIN) 10 MG tablet, Take 10 mg by mouth daily as needed for allergies., Disp: , Rfl:    Multiple Vitamin (MULTIVITAMIN) capsule, Take 1 capsule by mouth daily., Disp: , Rfl:  [2]  Social History Tobacco Use  Smoking Status Never  Smokeless Tobacco Never  [3]  Allergies Allergen Reactions   Tramadol      Heart racing   Codeine Rash   Oxycodone  Rash   Tape Rash    Only use paper tape

## 2024-02-17 NOTE — Anesthesia Postprocedure Evaluation (Signed)
 Anesthesia Post Note  Patient: Meredith Whitaker  Procedure(s) Performed: EXCISION, MORTON'S NEUROMA (Right: Foot)  Patient location during evaluation: PACU Anesthesia Type: General Level of consciousness: awake and alert Pain management: pain level controlled Vital Signs Assessment: post-procedure vital signs reviewed and stable Respiratory status: spontaneous breathing, nonlabored ventilation, respiratory function stable and patient connected to nasal cannula oxygen Cardiovascular status: blood pressure returned to baseline and stable Postop Assessment: no apparent nausea or vomiting Anesthetic complications: no   No notable events documented.   Last Vitals:  Vitals:   02/02/24 0845 02/02/24 0937  BP: (!) 145/96 (!) 151/82  Pulse: 73 76  Resp: 20 16  Temp:  (!) 36.3 C  SpO2: 100% 100%    Last Pain:  Vitals:   02/02/24 0937  TempSrc: Temporal  PainSc: 0-No pain                 Lynwood KANDICE Clause

## 2024-02-25 ENCOUNTER — Encounter: Admitting: Podiatry

## 2024-03-11 ENCOUNTER — Ambulatory Visit

## 2024-03-11 DIAGNOSIS — G5781 Other specified mononeuropathies of right lower limb: Secondary | ICD-10-CM | POA: Diagnosis not present

## 2024-03-11 DIAGNOSIS — Z9889 Other specified postprocedural states: Secondary | ICD-10-CM | POA: Diagnosis not present

## 2024-03-11 DIAGNOSIS — S93402D Sprain of unspecified ligament of left ankle, subsequent encounter: Secondary | ICD-10-CM | POA: Diagnosis not present

## 2024-03-11 NOTE — Progress Notes (Signed)
 "  Subjective:  Patient ID: Meredith Whitaker, female    DOB: 1970-12-02,  MRN: [id number]  Chief Complaint  Patient presents with   Routine Post Op    DOS 12/1/25Possible infection and sx spot, red and swollen around stitch area. Hasn't notices any drainage, non diabteic      DOS: 02/02/24 Procedure: Right foot 3rd and 4th interspace neurectomies  54 y.o. female returns for post-op check. She is doing well in her recovery. She went on a trip to [place] and walked a significant amount. She relates that the foot is doing well. She also states that taking time off for her right foot to heal has made her left ankle feel much better. She is no longer using the brace. She denies any other issues or complications.   Review of Systems: Negative except as noted in the HPI. Denies N/V/F/Ch.  Past Medical History:  Diagnosis Date   Cervical cancer (HCC)    Morton neuroma of right foot    Sprain of anterior talofibular ligament of left ankle    Current Medications[1]  Tobacco Use History[2]  Allergies[3] Objective:  There were no vitals filed for this visit. There is no height or weight on file to calculate BMI. Constitutional Well developed. Well nourished.  Vascular Foot warm and well perfused. Capillary refill normal to all digits.  No calf pain, tenderness, erythema, or tightness.   Neurologic Normal speech. Oriented to person, place, and time. Epicritic sensation to light touch grossly present bilaterally. Numbness to lateral 3rd toe, 4th toe, and medial 5th toe.   Dermatologic Skin well healed without signs of infection. Single suture is visible. All other sutures removed. Skin edges well healed without signs of infection. Mild edema around surgical site.   Orthopedic: No tenderness to palpation noted about the surgical site within normal limits. 5/5 muscle strength to all major pedal muscle groups left. Minimal pain to palpation of ATFL and peroneal tendons. No pain with activation of  peroneal tendons. Minimal laxity on anterior drawer testing.    Radiographs: Not needed due to soft tissue only surgery Assessment:   1. 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 Interdigital neuroma of right foot   3. Status post right foot surgery   4. Moderate left ankle sprain, subsequent encounter      Plan:  Patient was evaluated and treated and all questions answered. Doing well in post operative recovery.   S/p Right foot surgery  -Progressing as expected post-operatively. Doing well in recovery. She has progressed to full activity and noticed mild swelling and pain. She is very satisfied with the procedure.  -XR: Not needed.  -WB Status: Weight bearing as tolerated in normal shoe. -Sutures: Residual suture removed today without complication or dehiscence. Well healed skin.  -Medications: None needed today -XR at next visit: not needed   Left ankle sprain - improved significantly after resting for right foot healing. She states she does not feel unstable on the left ankle any longer. She would like to hold off on left ankle stabilization with arthroscopy for the time being. She will return to clinic if she experiences pain of instability.  - MRI shows thickened/abnormal signal ATFL, mild peroneal tenosynovitis  RTC PRN  No follow-ups on file.      [1]  Current Outpatient Medications:    hydrOXYzine (ATARAX) 25 MG tablet, Take 25 mg by mouth at bedtime as needed (sleep)., Disp: , Rfl:    loratadine (CLARITIN) 10 MG tablet,  Take 10 mg by mouth daily as needed for allergies., Disp: , Rfl:    Multiple Vitamin (MULTIVITAMIN) capsule, Take 1 capsule by mouth daily., Disp: , Rfl:  [2]  Social History Tobacco Use  Smoking Status Never  Smokeless Tobacco Never  [3]  Allergies Allergen Reactions   Tramadol      Heart racing   Codeine Rash   Oxycodone  Rash   Tape Rash    Only use paper tape   "

## 2024-03-17 ENCOUNTER — Encounter
